# Patient Record
Sex: Male | Born: 1960 | Race: White | Hispanic: No | Smoking: Current every day smoker
Health system: Southern US, Community
[De-identification: ages and names within clinical notes are randomized; demographics above are authoritative.]

---

## 2011-10-02 ENCOUNTER — Ambulatory Visit: Payer: PRIVATE HEALTH INSURANCE

## 2011-10-02 ENCOUNTER — Encounter: Payer: Self-pay | Admitting: Family Medicine

## 2011-10-02 ENCOUNTER — Ambulatory Visit (INDEPENDENT_AMBULATORY_CARE_PROVIDER_SITE_OTHER): Payer: PRIVATE HEALTH INSURANCE | Admitting: Family Medicine

## 2011-10-02 VITALS — BP 134/100 | HR 98 | Temp 98.2°F | Resp 16 | Ht 71.0 in | Wt 179.0 lb

## 2011-10-02 DIAGNOSIS — M25519 Pain in unspecified shoulder: Secondary | ICD-10-CM

## 2011-10-02 DIAGNOSIS — R0781 Pleurodynia: Secondary | ICD-10-CM

## 2011-10-02 DIAGNOSIS — R079 Chest pain, unspecified: Secondary | ICD-10-CM

## 2011-10-02 MED ORDER — HYDROCODONE-ACETAMINOPHEN 5-500 MG PO TABS: 1.0000 | ORAL_TABLET | Freq: Three times a day (TID) | ORAL | Status: AC | PRN
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
## 2011-11-05 ENCOUNTER — Ambulatory Visit: Payer: PRIVATE HEALTH INSURANCE

## 2011-11-05 ENCOUNTER — Ambulatory Visit (INDEPENDENT_AMBULATORY_CARE_PROVIDER_SITE_OTHER): Payer: PRIVATE HEALTH INSURANCE | Admitting: Emergency Medicine

## 2011-11-05 ENCOUNTER — Telehealth: Payer: Self-pay | Admitting: Emergency Medicine

## 2011-11-05 VITALS — BP 125/81 | HR 81 | Temp 98.9°F | Resp 17 | Ht 71.0 in | Wt 175.0 lb

## 2011-11-05 DIAGNOSIS — R079 Chest pain, unspecified: Secondary | ICD-10-CM

## 2011-11-05 DIAGNOSIS — S2239XA Fracture of one rib, unspecified side, initial encounter for closed fracture: Secondary | ICD-10-CM

## 2011-11-05 NOTE — Telephone Encounter (Signed)
Patient notified

## 2011-11-05 NOTE — Progress Notes (Signed)
  Subjective:    Patient ID: Steve Wright, male    DOB: 07-06-1960, 51 y.o.   MRN: [id number]  HPI patient has 2 warts on his ankle he would like removed by freezing . He was also seen approximately one month ago with a left eighth rib fracture and continues to have pain in that area.    Review of Systems     Objective:   Physical Exam chest exam is clear to auscultation. There is tenderness over the left lateral ribs. Abdomen is soft. There 1 x 1 cm warts over the left Achilles    [facility] reading (PRIMARY) by  Dr. Cleta Alberts there is a nondisplaced left eighth rib fracture with no pneumothorax. There is also fracture of the ninth rib. I received a call from the radiologist and there  may be a new fracture lateral left seventh and eighth ribs there does appear to be a fracture at the base of the scapula. .       Assessment & Plan:  Patient to return to clinic once we have by liquid nitrogen and will take care of his warts. Patient will be informed of his x-ray report

## 2011-11-05 NOTE — Telephone Encounter (Signed)
Please call patient and tell him the fractures of the eighth and ninth ribs are visible as well as possible new fracture of the seventh rib. There also appears to be a crack of the scapula. This does not change any of his treatment

## 2011-11-12 ENCOUNTER — Ambulatory Visit (INDEPENDENT_AMBULATORY_CARE_PROVIDER_SITE_OTHER): Payer: PRIVATE HEALTH INSURANCE | Admitting: Emergency Medicine

## 2011-11-12 VITALS — BP 134/75 | HR 90 | Temp 98.2°F | Resp 16 | Ht 72.0 in | Wt 176.0 lb

## 2011-11-12 DIAGNOSIS — B079 Viral wart, unspecified: Secondary | ICD-10-CM

## 2011-11-12 NOTE — Progress Notes (Signed)
  Subjective:    Patient ID: Steve Wright, male    DOB: June 27, 1960, 51 y.o.   MRN: [id number]  HPI patient returns at my request for cryotherapy of 2 warts over the left Achilles tendon to    Review of Systems     Objective:   Physical Exam there are 2 warts present on the left Achilles. They measure about 6 mm. Each were frozen with liquid nitrogen for 8 seconds x2        Assessment & Plan:  Warts treated with cryotherapy x2

## 2013-06-13 IMAGING — CR DG SHOULDER 2+V*L*
3 series · 3 of 3 positions shown · non-contrast
Comparison: Chest x-ray and rib series today.

CLINICAL DATA: a fall, left shoulder pain.

LEFT SHOULDER - 2+ VIEW

[ap ext rot]
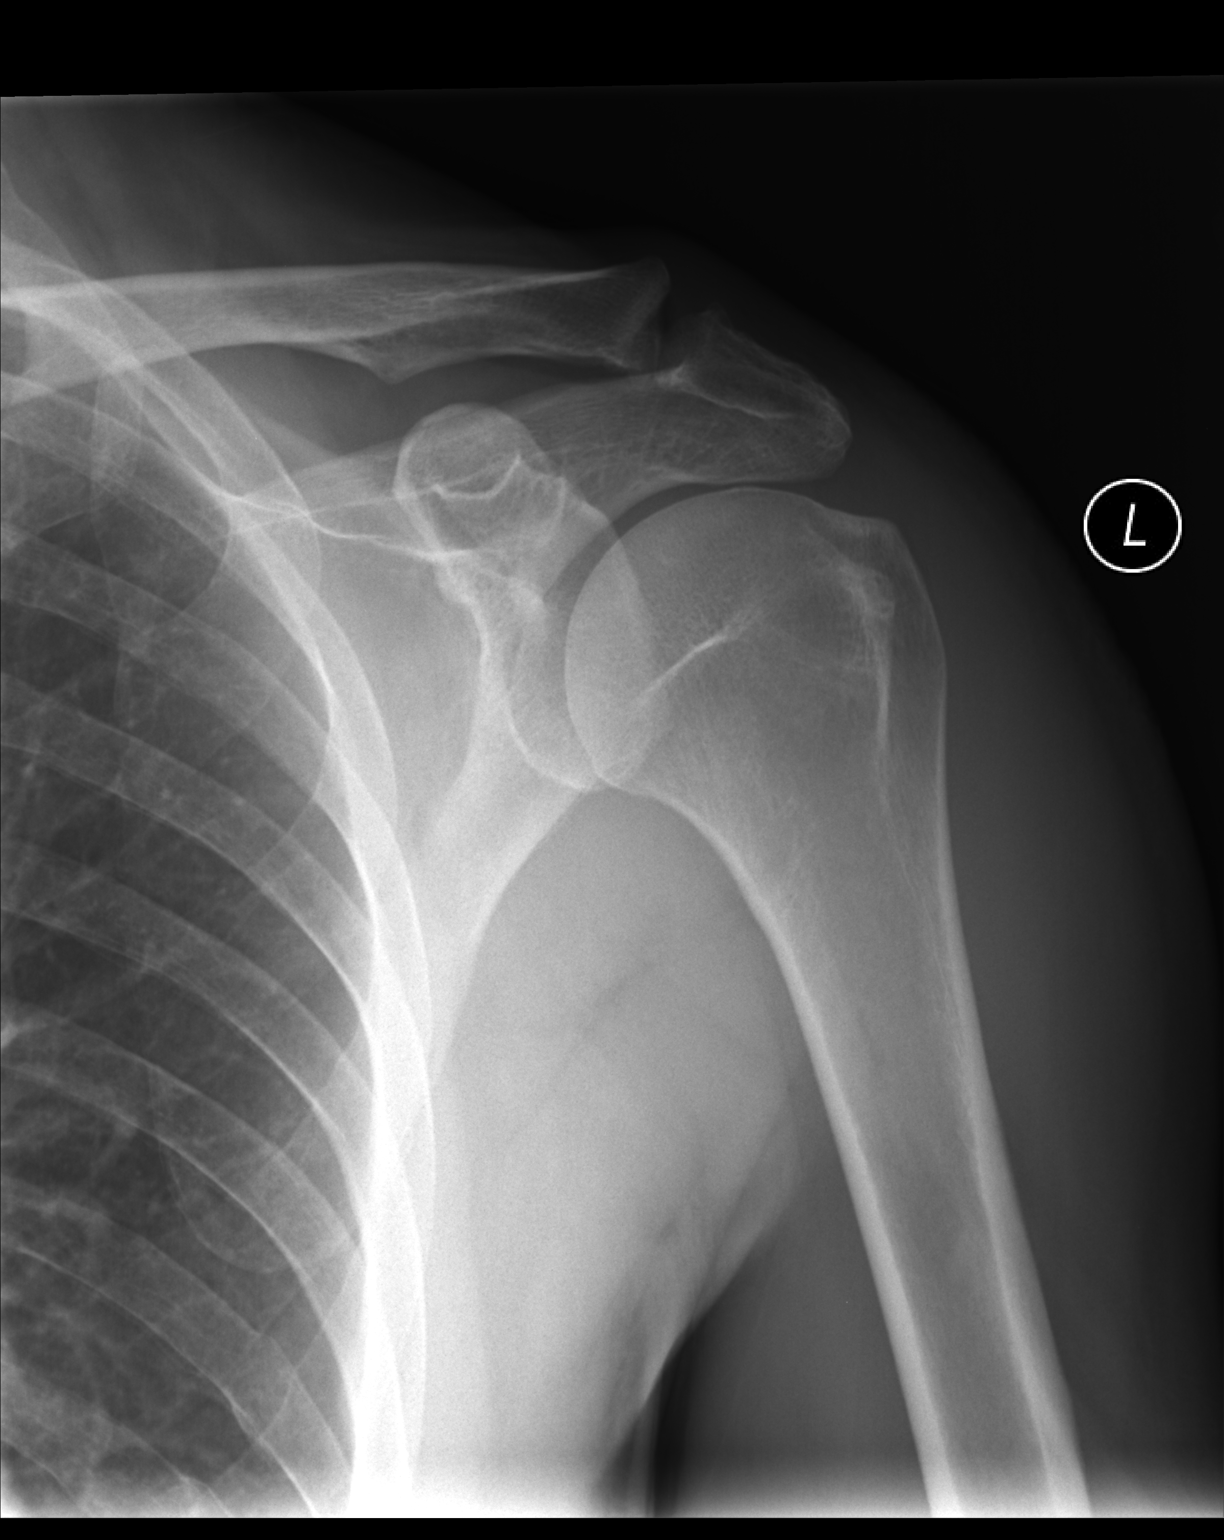

[ap int rot]
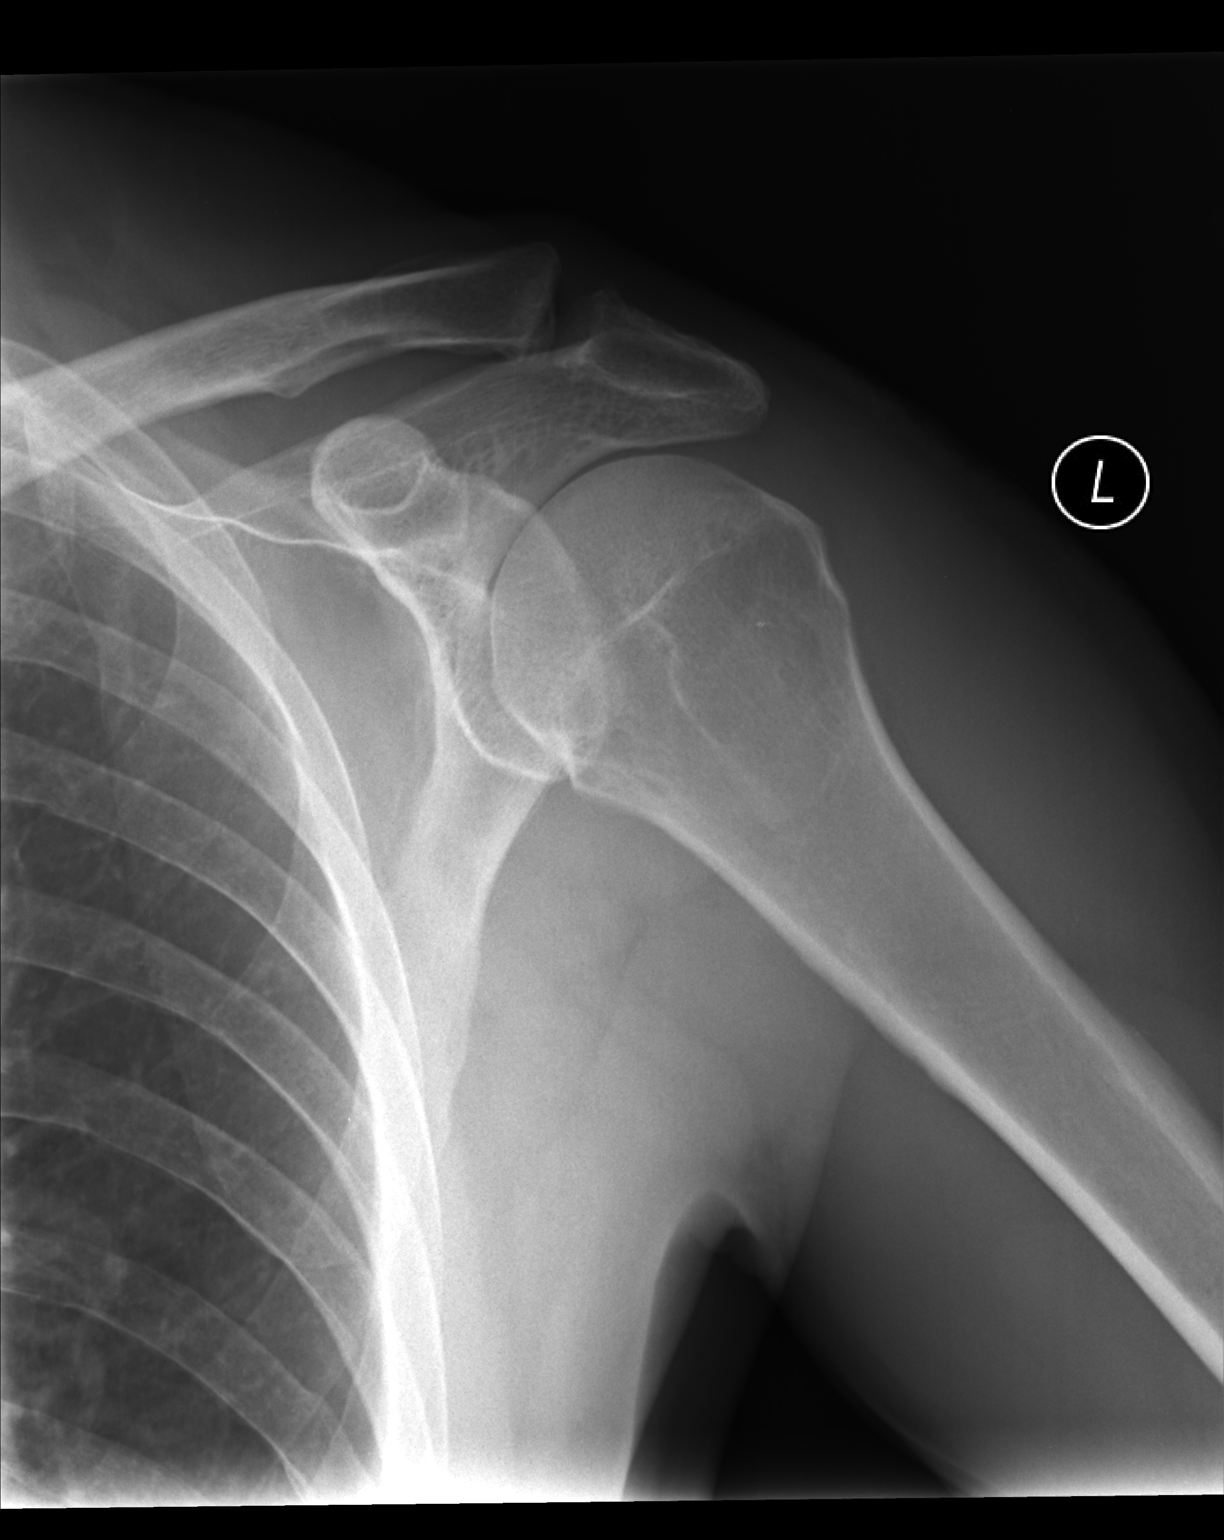

[pa y view]
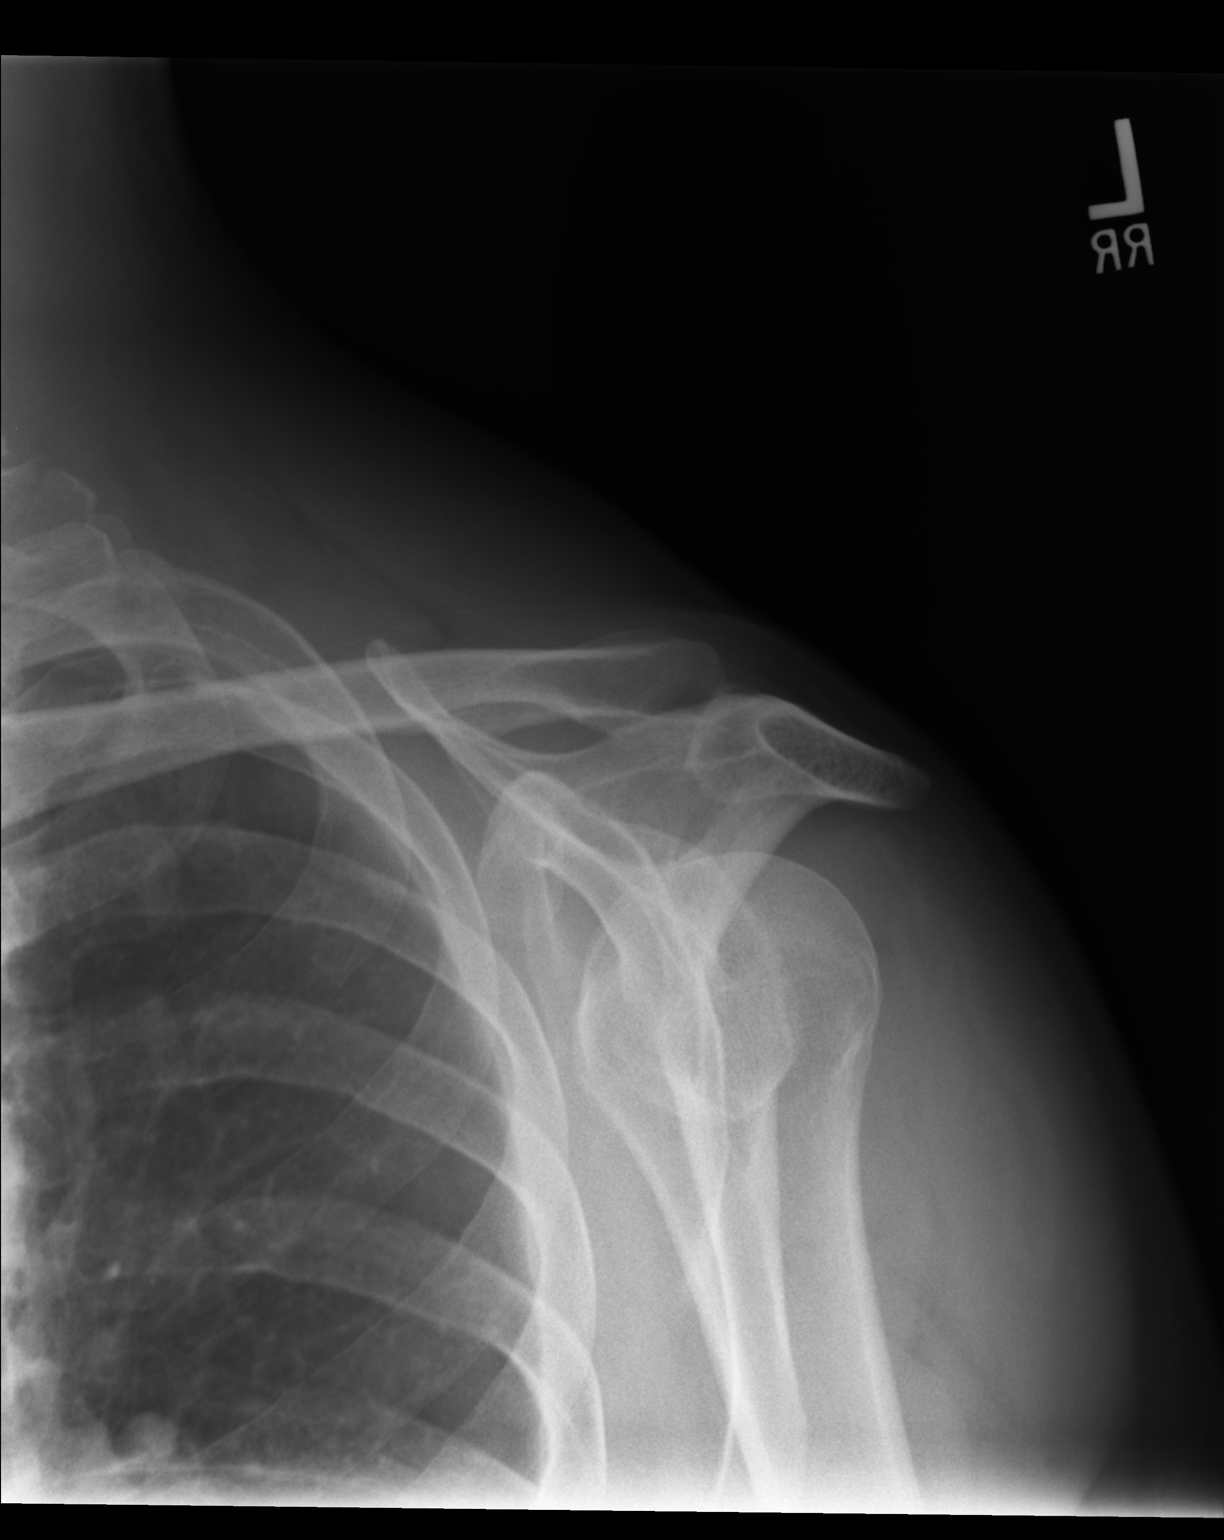

[3 of 3 positions shown; findings below may reference images not displayed]

FINDINGS: No acute bony abnormality within the left shoulder.  No
fracture, subluxation or dislocation.  Again noted is the buckle
fracture through the left posterior lateral eighth rib as seen on
rib series.
IMPRESSION: Nondisplaced left eighth rib fracture.

No acute bony abnormality within the left shoulder.

Clinically significant discrepancy from primary report, if
provided: None

## 2013-07-17 IMAGING — CR DG RIBS W/ CHEST 3+V*L*
4 series · 4 of 4 positions shown · non-contrast
Comparison: 10/02/2011

***ADDENDUM*** CREATED: 11/05/2011 [DATE]

There is irregularity along the inferior aspect of the left
scapula.  Findings are concerning for a fracture in this area.
***END ADDENDUM*** SIGNED BY: Jag Grau, M.D.
CLINICAL DATA: Evaluate for rib fracture.
LEFT RIBS AND CHEST - 3+ VIEW

[PA (1 of 2)]
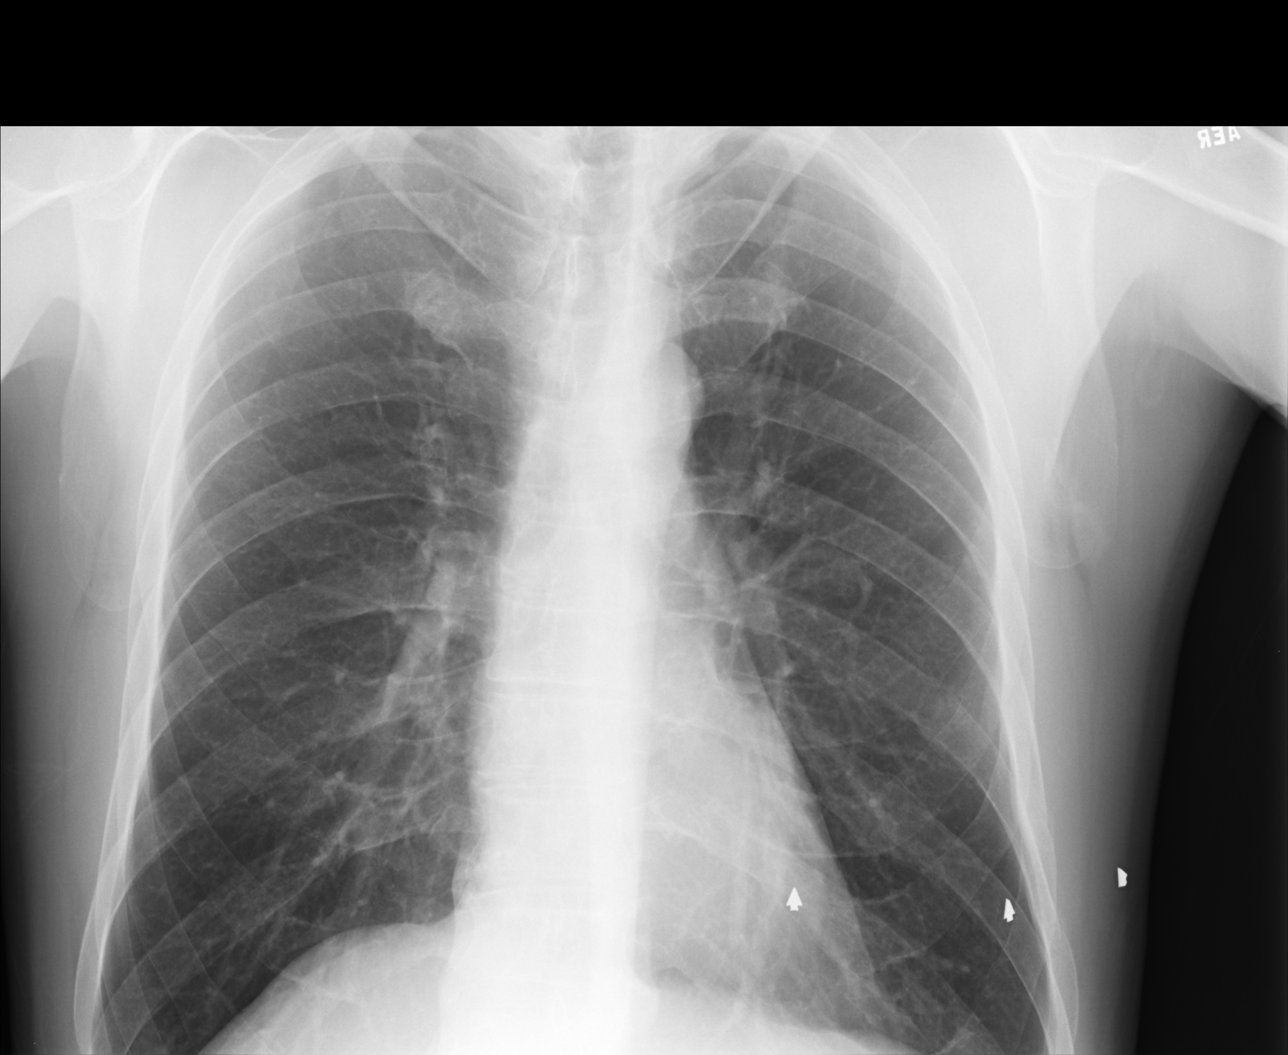

[rao]
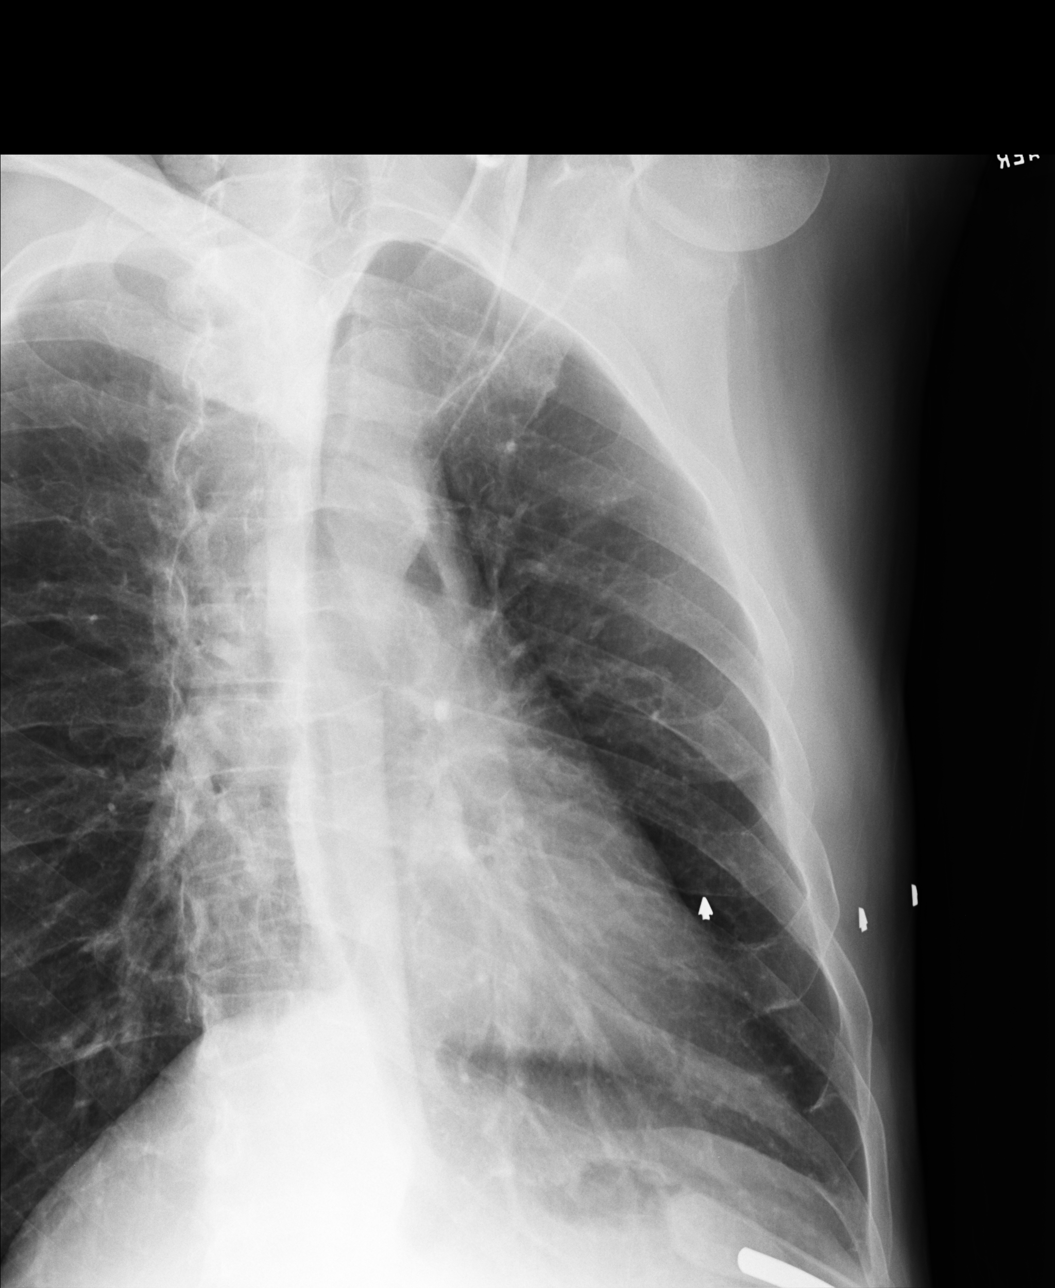

[lao]
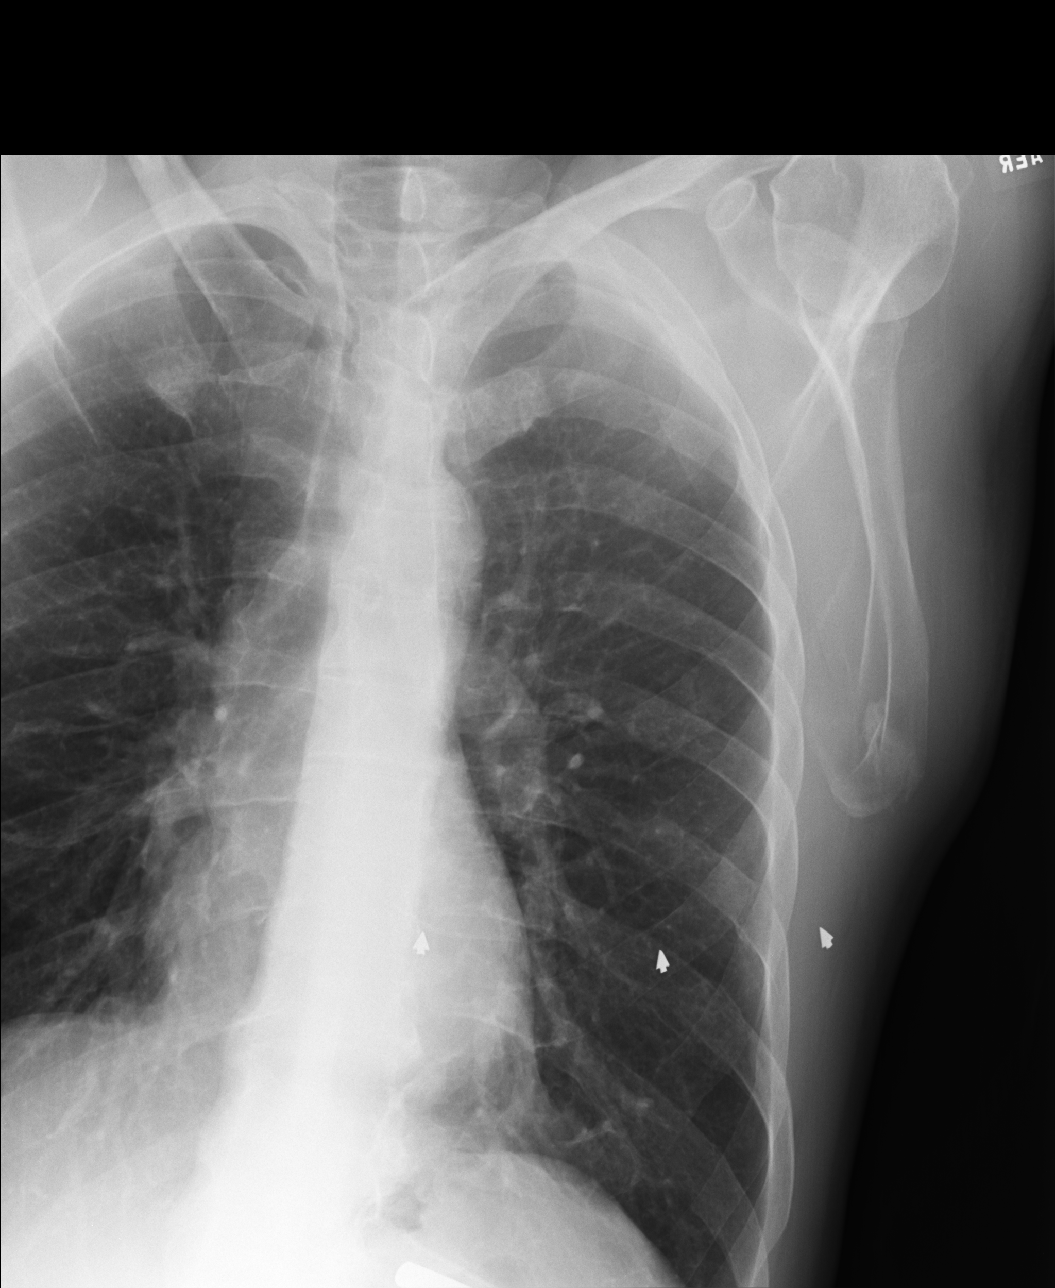

[PA (2 of 2)]
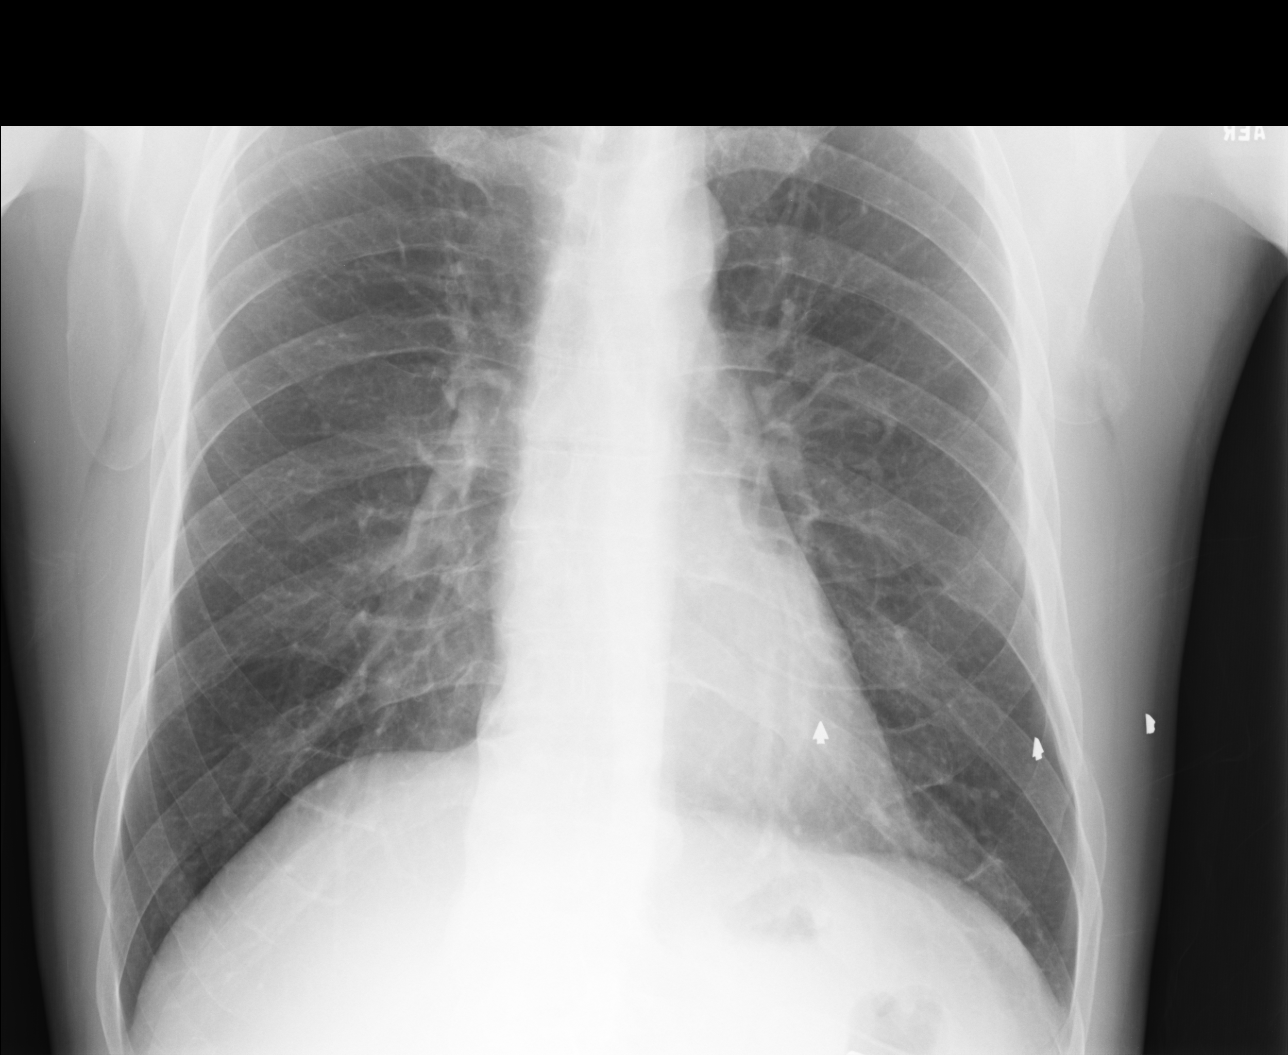

[4 of 4 positions shown; findings below may reference images not displayed]

FINDINGS: Again noted is a mildly displaced left eighth rib
fracture with some callus formation.  There is also mild displaced
left ninth rib fracture which was present on the prior examination.
There is concern for new mildly displaced fractures involving the
lateral left seventh and eighth ribs.  Lungs are clear without
airspace disease or pneumothorax. Heart and mediastinum are within
normal limits.  Trachea is midline.
IMPRESSION: Concern for new fractures involving the lateral left
seventh and eighth ribs.

Old fractures of the left eighth and ninth ribs.

These results will be called to the ordering clinician or
representative by the Radiologist Assistant, and communication
documented in the PACS Dashboard.

## 2014-11-02 ENCOUNTER — Ambulatory Visit (INDEPENDENT_AMBULATORY_CARE_PROVIDER_SITE_OTHER): Payer: BLUE CROSS/BLUE SHIELD | Admitting: Family Medicine

## 2014-11-02 VITALS — BP 104/64 | HR 88 | Temp 98.6°F | Resp 18 | Ht 72.0 in | Wt 173.0 lb

## 2014-11-02 DIAGNOSIS — Z23 Encounter for immunization: Secondary | ICD-10-CM

## 2014-11-02 DIAGNOSIS — S0101XA Laceration without foreign body of scalp, initial encounter: Secondary | ICD-10-CM

## 2014-11-02 DIAGNOSIS — L253 Unspecified contact dermatitis due to other chemical products: Secondary | ICD-10-CM

## 2014-11-02 DIAGNOSIS — B001 Herpesviral vesicular dermatitis: Secondary | ICD-10-CM

## 2014-11-02 MED ORDER — HYDROCORTISONE 2.5 % EX OINT: TOPICAL_OINTMENT | Freq: Two times a day (BID) | CUTANEOUS | Status: DC

## 2014-11-02 MED ORDER — VALACYCLOVIR HCL 1 G PO TABS: 2000.0000 mg | ORAL_TABLET | Freq: Two times a day (BID) | ORAL | Status: DC

## 2014-11-02 NOTE — Patient Instructions (Signed)

## 2014-11-02 NOTE — Progress Notes (Signed)
Subjective:   This chart was scribed for Nilda Simmer, MD by Jarvis Morgan, ED Scribe. This patient was seen in Room 1 and the patient's care was started at 5:27 PM.    Patient ID: Steve Wright, male    DOB: 09/19/1960, 54 y.o.   MRN: [id number]  11/02/2014  Rash and Other   HPI  HPI Comments: Steve Wright is a 54 y.o. male who presents to the [facility] complaining of an intermittent, dry, cracked, painful, red rash to his B thumbs and right index finger onset 1 year. 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 He denies any known allergies. He reports he has used antifungal cream sporadically for his rash with no relief. He notes he also hit an area to the right parietal side of his head while working outside yesterday and has a small laceration to the area; he reports he had significant bleeding to the area yesterday. There is no active bleeding at this time. He thinks he may have hit it on wood or a nail. He states his tetanus is not up to date. He does not typically wear gloves when working outside. Pt denies any surgical history. He denies any fever, chills, HA or other complaints.  Pt has a second complaint of recurrent blisters to his right upper and lower lip. He states he gets them in the same area on a regular basis. Pt notes they are exacerbated by sunlight and stress. Pt would like medication to treat the area. He currently uses Abreva.  PCP: Tally Due, MD   Review of Systems  Constitutional: Negative for fever and chills.  Skin: Positive for color change, rash and wound.  Neurological: Positive for numbness. Negative for dizziness and headaches.    History reviewed. No pertinent past medical history. History reviewed. No pertinent past surgical history. No Known Allergies Current Outpatient Prescriptions  Medication Sig Dispense  Refill  . hydrocortisone 2.5 % ointment Apply topically 2 (two) times daily. 30 g 3  . valACYclovir (VALTREX) 1000 MG tablet Take 2 tablets (2,000 mg total) by mouth 2 (two) times daily. For one day with each outbreak 30 tablet 0   No current facility-administered medications for this visit.   Social History   Social History  . Marital Status: Divorced    Spouse Name: N/A  . Number of Children: N/A  . Years of Education: N/A   Occupational History  . Not on file.   Social History Main Topics  . Smoking status: Current Every Day Smoker -- 1.00 packs/day for 34 years    Types: Cigarettes  . Smokeless tobacco: Not on file  . Alcohol Use: Not on file  . Drug Use: Not on file  . Sexual Activity: Not on file   Other Topics Concern  . Not on file   Social History Narrative       Objective:    Triage Vitals: BP 104/64 mmHg  Pulse 88  Temp(Src) 98.6 F (37 C)  Resp 18  Ht 6' (1.829 m)  Wt 173 lb (78.472 kg)  BMI 23.46 kg/m2  SpO2 98%  Physical Exam  Constitutional: He is oriented to person, place, and time. He appears well-developed and well-nourished. No distress.  HENT:  Head: Normocephalic.  Linear well approximated laceration about 2cm along parietal region  Eyes: Conjunctivae  and EOM are normal.  Neck: Neck supple. No tracheal deviation present.  Musculoskeletal: Normal range of motion.  Neurological: He is alert and oriented to person, place, and time.  Skin: Skin is warm and dry. Rash noted. He is not diaphoretic. No erythema.  Left thumb with thickening of skin with fissuring present.  Similar changes on right thumb proximally and along 2nd fight finger at DIP joint with fissuring.  R upper lip with small vesicular lesion.  Psychiatric: His behavior is normal.  Nursing note and vitals reviewed.  No results found for this or any previous visit.   TDAP ADMINISTERED.    Assessment & Plan:   1. Herpes labialis   2. Laceration of scalp, initial encounter     3. Contact dermatitis due to chemicals     1.  Herpes Labialis:  Recurrent; rx for Valtrex 1gram provided to use PRN. 2.  Laceration of scalp initial encounter:  New.  S/p TDAP 3.  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 Rx for hydrocortisone ointment 2.5% provided to use bid; recommend applying vaseline to hands qhs.  Recommend wearing gloves during work.   Meds ordered this encounter  Medications  . hydrocortisone 2.5 % ointment    Sig: Apply topically 2 (two) times daily.    Dispense:  30 g    Refill:  3  . valACYclovir (VALTREX) 1000 MG tablet    Sig: Take 2 tablets (2,000 mg total) by mouth 2 (two) times daily. For one day with each outbreak    Dispense:  30 tablet    Refill:  0    No Follow-up on file.  I personally performed the services described in this documentation, which was scribed in my presence. The recorded information has been reviewed and considered.  Nishawn Rotan Paulita Fujita, M.D. [facility] [address] [place], [place]  [address] [phone] phone [phone] fax

## 2016-08-27 ENCOUNTER — Encounter: Payer: Self-pay | Admitting: Family Medicine

## 2016-08-27 ENCOUNTER — Ambulatory Visit (INDEPENDENT_AMBULATORY_CARE_PROVIDER_SITE_OTHER): Payer: PRIVATE HEALTH INSURANCE | Admitting: Family Medicine

## 2016-08-27 VITALS — BP 120/73 | HR 84 | Temp 98.3°F | Resp 18 | Ht 71.22 in | Wt 167.8 lb

## 2016-08-27 DIAGNOSIS — B001 Herpesviral vesicular dermatitis: Secondary | ICD-10-CM

## 2016-08-27 DIAGNOSIS — Z1211 Encounter for screening for malignant neoplasm of colon: Secondary | ICD-10-CM | POA: Diagnosis not present

## 2016-08-27 DIAGNOSIS — S61411A Laceration without foreign body of right hand, initial encounter: Secondary | ICD-10-CM | POA: Diagnosis not present

## 2016-08-27 DIAGNOSIS — B078 Other viral warts: Secondary | ICD-10-CM

## 2016-08-27 DIAGNOSIS — R600 Localized edema: Secondary | ICD-10-CM | POA: Diagnosis not present

## 2016-08-27 MED ORDER — VALACYCLOVIR HCL 1 G PO TABS: 2000.0000 mg | ORAL_TABLET | Freq: Two times a day (BID) | ORAL | 0 refills | Status: DC
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
## 2016-08-27 NOTE — Patient Instructions (Addendum)
IF you received an x-ray today, you will receive an invoice from [facility]. Please contact [facility] at [phone] with questions or concerns regarding your invoice.   IF you received labwork today, you will receive an invoice from [place]. Please contact [facility] at [phone] with questions or concerns regarding your invoice.   Our billing staff will not be able to assist you with questions regarding bills from these companies.  You will be contacted with the lab results as soon as they are available. The fastest way to get your results is to activate your My Chart account. Instructions are located on the last page of this paperwork. If you have not heard from [place] regarding the results in 2 weeks, please contact this office.      Peripheral Edema Peripheral edema is swelling that is caused by a buildup of fluid. Peripheral edema most often affects the lower legs, ankles, and feet. It can also develop in the arms, hands, and face. The area of the body that has peripheral edema will look swollen. It may also feel heavy or warm. Your clothes may start to feel tight. Pressing on the area may make a temporary dent in your skin. You may not be able to move your arm or leg as much as usual. There are many causes of peripheral edema. It can be a complication of other diseases, such as congestive heart failure, kidney disease, or a problem with your blood circulation. It also can be a side effect of certain medicines. It often happens to women during pregnancy. Sometimes, the cause is not known. Treating the underlying condition is often the only treatment for peripheral edema. Follow these instructions at home: Pay attention to any changes in your symptoms. Take these actions to help with your discomfort:  Raise (elevate) your legs while you are sitting or lying down.  Move around often to prevent stiffness and to lessen swelling. Do not sit or stand for long periods of  time.  Wear support stockings as told by your health care provider.  Follow instructions from your health care provider about limiting salt (sodium) in your diet. Sometimes eating less salt can reduce swelling.  Take over-the-counter and prescription medicines only as told by your health care provider. Your health care provider may prescribe medicine to help your body get rid of excess water (diuretic).  Keep all follow-up visits as told by your health care provider. This is important.  Contact a health care provider if:  You have a fever.  Your edema starts suddenly or is getting worse, especially if you are pregnant or have a medical condition.  You have swelling in only one leg.  You have increased swelling and pain in your legs. Get help right away if:  You develop shortness of breath, especially when you are lying down.  You have pain in your chest or abdomen.  You feel weak.  You faint. This information is not intended to replace advice given to you by your health care provider. Make sure you discuss any questions you have with your health care provider. Document Released: 04/10/2004 Document Revised: 08/06/2015 Document Reviewed: 09/13/2014 Elsevier Interactive Patient Education  2018 [employer] Inc[employer] Pneumococcal Polysaccharide Vaccine: What You Need to Know 1. Why get vaccinated? Vaccination can protect older adults (and some children and younger adults) from pneumococcal disease. Pneumococcal disease is caused by bacteria that can spread from person to person through close contact. It can cause ear infections, and it can  also lead to more serious infections of the:  Lungs (pneumonia),  Blood (bacteremia), and  Covering of the brain and spinal cord (meningitis). Meningitis can cause deafness and brain damage, and it can be fatal.  Anyone can get pneumococcal disease, but children under 442 years of age, people with certain medical conditions, adults over 56 years of  age, and cigarette smokers are at the highest risk. About 18,000 older adults die each year from pneumococcal disease in the [place]. Treatment of pneumococcal infections with penicillin and other drugs used to be more effective. But some strains of the disease have become resistant to these drugs. This makes prevention of the disease, through vaccination, even more important. 2. Pneumococcal polysaccharide vaccine (PPSV23) Pneumococcal polysaccharide vaccine (PPSV23) protects against 23 types of pneumococcal bacteria. It will not prevent all pneumococcal disease. PPSV23 is recommended for:  All adults 56 years of age and older,  Anyone 2 through 56 years of age with certain long-term health problems,  Anyone 2 through 56 years of age with a weakened immune system,  Adults 6519 through 56 years of age who smoke cigarettes or have asthma.  Most people need only one dose of PPSV. A second dose is recommended for certain high-risk groups. People 1165 and older should get a dose even if they have gotten one or more doses of the vaccine before they turned 65. Your healthcare provider can give you more information about these recommendations. Most healthy adults develop protection within 2 to 3 weeks of getting the shot. 3. Some people should not get this vaccine  Anyone who has had a life-threatening allergic reaction to PPSV should not get another dose.  Anyone who has a severe allergy to any component of PPSV should not receive it. Tell your provider if you have any severe allergies.  Anyone who is moderately or severely ill when the shot is scheduled may be asked to wait until they recover before getting the vaccine. Someone with a mild illness can usually be vaccinated.  Children less than 512 years of age should not receive this vaccine.  There is no evidence that PPSV is harmful to either a pregnant woman or to her fetus. However, as a precaution, women who need the vaccine should be  vaccinated before becoming pregnant, if possible. 4. Risks of a vaccine reaction With any medicine, including vaccines, there is a chance of side effects. These are usually mild and go away on their own, but serious reactions are also possible. About half of people who get PPSV have mild side effects, such as redness or pain where the shot is given, which go away within about two days. Less than 1 out of 100 people develop a fever, muscle aches, or more severe local reactions. Problems that could happen after any vaccine:  People sometimes faint after a medical procedure, including vaccination. Sitting or lying down for about 15 minutes can help prevent fainting, and injuries caused by a fall. Tell your doctor if you feel dizzy, or have vision changes or ringing in the ears.  Some people get severe pain in the shoulder and have difficulty moving the arm where a shot was given. This happens very rarely.  Any medication can cause a severe allergic reaction. Such reactions from a vaccine are very rare, estimated at about 1 in a million doses, and would happen within a few minutes to a few hours after the vaccination. As with any medicine, there is a very remote chance of a  vaccine causing a serious injury or death. The safety of vaccines is always being monitored. For more information, visit: http://floyd.org/ 5. What if there is a serious reaction? What should I look for? Look for anything that concerns you, such as signs of a severe allergic reaction, very high fever, or unusual behavior. Signs of a severe allergic reaction can include hives, swelling of the face and throat, difficulty breathing, a fast heartbeat, dizziness, and weakness. These would usually start a few minutes to a few hours after the vaccination. What should I do? If you think it is a severe allergic reaction or other emergency that can't wait, call 9-1-1 or get to the nearest hospital. Otherwise, call your  doctor. Afterward, the reaction should be reported to the Vaccine Adverse Event Reporting System (VAERS). Your doctor might file this report, or you can do it yourself through the VAERS web site at www.vaers.LAgents.no, or by calling 1-9401007951. VAERS does not give medical advice. 6. How can I learn more?  Ask your doctor. He or she can give you the vaccine package insert or suggest other sources of information.  Call your local or state health department.  Contact the Centers for Disease Control and Prevention (CDC): ? Call 510-353-7805 (1-800-CDC-INFO) or ? Visit CDC's website at PicCapture.uy CDC Pneumococcal Polysaccharide Vaccine VIS (07/08/13) This information is not intended to replace advice given to you by your health care provider. Make sure you discuss any questions you have with your health care provider. Document Released: 12/29/2005 Document Revised: 11/22/2015 Document Reviewed: 11/22/2015 Elsevier Interactive Patient Education  2017 Elsevier Inc.  Cryosurgery for Skin Conditions, Care After These instructions give you information on caring for yourself after your procedure. Your doctor may also give you more specific instructions. Call your doctor if you have any problems or questions after your procedure. Follow these instructions at home: Caring for the treated area  Follow instructions from your doctor about how to take care of the treated area. Make sure you: ? Keep the area covered with a bandage (dressing) until it heals, or for as long as told by your doctor. ? Wash your hands with soap and water before you change your bandage. If you do not have soap and water, use hand sanitizer. ? Change your bandage as told by your doctor. ? Keep the bandage and the treated area clean and dry. If the bandage gets wet, change it right away. ? Clean the treated area with soap and water.  Check the treated area every day for signs of infection. Check for: ? More  redness, swelling, or pain. ? More fluid or blood. ? Warmth. ? Pus or a bad smell. General instructions  Do not pick at your blister. Do not try to break it open. This can cause infection and scarring.  Do not put any medicine, cream, or lotion on the treated area unless told by your doctor.  Take over-the-counter and prescription medicines only as told by your doctor.  Keep all follow-up visits as told by your doctor. This is important. Contact a doctor if:  You have more redness, swelling, or pain around the treated area.  You have more fluid or blood coming from the treated area.  The treated area feels warm to the touch.  You have pus or a bad smell coming from the treated area.  Your blister gets large and painful. Get help right away if:  You have a fever and have redness spreading from the treated area. Summary  You  should keep the treated area and your bandage clean and dry.  Check the treated area every day for signs of infection. Signs include fluid, pus, warmth, or having more redness, swelling, or pain.  Do not pick at your blister. Do not try to break it open. This information is not intended to replace advice given to you by your health care provider. Make sure you discuss any questions you have with your health care provider. Document Released: 05/26/2011 Document Revised: 01/21/2016 Document Reviewed: 01/21/2016 Elsevier Interactive Patient Education  2017 [employer].

## 2016-08-28 LAB — CBC
HEMATOCRIT: 44.4 % (ref 37.5–51.0)
Hemoglobin: 14.7 g/dL (ref 13.0–17.7)
MCH: 29.9 pg (ref 26.6–33.0)
MCHC: 33.1 g/dL (ref 31.5–35.7)
MCV: 90 fL (ref 79–97)
PLATELETS: 298 10*3/uL (ref 150–379)
RBC: 4.91 x10E6/uL (ref 4.14–5.80)
RDW: 14.2 % (ref 12.3–15.4)
WBC: 7.1 10*3/uL (ref 3.4–10.8)

## 2016-08-28 LAB — COMPREHENSIVE METABOLIC PANEL
ALK PHOS: 91 IU/L (ref 39–117)
ALT: 16 IU/L (ref 0–44)
AST: 23 IU/L (ref 0–40)
Albumin/Globulin Ratio: 1.9 (ref 1.2–2.2)
Albumin: 4.6 g/dL (ref 3.5–5.5)
BUN/Creatinine Ratio: 8 — ABNORMAL LOW (ref 9–20)
BUN: 8 mg/dL (ref 6–24)
Bilirubin Total: 0.6 mg/dL (ref 0.0–1.2)
CALCIUM: 10 mg/dL (ref 8.7–10.2)
CO2: 28 mmol/L (ref 20–29)
CREATININE: 0.97 mg/dL (ref 0.76–1.27)
Chloride: 99 mmol/L (ref 96–106)
GFR calc Af Amer: 101 mL/min/{1.73_m2} (ref >59)
GFR, EST NON AFRICAN AMERICAN: 88 mL/min/{1.73_m2} (ref >59)
GLUCOSE: 128 mg/dL — AB (ref 65–99)
Globulin, Total: 2.4 g/dL (ref 1.5–4.5)
Potassium: 4.2 mmol/L (ref 3.5–5.2)
SODIUM: 141 mmol/L (ref 134–144)
Total Protein: 7 g/dL (ref 6.0–8.5)

## 2016-08-28 LAB — TSH: TSH: 2.08 u[IU]/mL (ref 0.450–4.500)

## 2016-08-28 LAB — URIC ACID: Uric Acid: 5.9 mg/dL (ref 3.7–8.6)

## 2016-08-29 ENCOUNTER — Encounter: Payer: Self-pay | Admitting: Family Medicine

## 2017-12-31 ENCOUNTER — Ambulatory Visit (INDEPENDENT_AMBULATORY_CARE_PROVIDER_SITE_OTHER): Payer: PRIVATE HEALTH INSURANCE | Admitting: Physician Assistant

## 2017-12-31 ENCOUNTER — Ambulatory Visit (INDEPENDENT_AMBULATORY_CARE_PROVIDER_SITE_OTHER): Payer: PRIVATE HEALTH INSURANCE

## 2017-12-31 ENCOUNTER — Other Ambulatory Visit: Payer: Self-pay

## 2017-12-31 ENCOUNTER — Encounter: Payer: Self-pay | Admitting: Physician Assistant

## 2017-12-31 VITALS — BP 107/75 | HR 89 | Temp 97.9°F | Resp 20 | Ht 71.73 in | Wt 165.4 lb

## 2017-12-31 DIAGNOSIS — M5441 Lumbago with sciatica, right side: Secondary | ICD-10-CM

## 2017-12-31 DIAGNOSIS — G8929 Other chronic pain: Secondary | ICD-10-CM

## 2017-12-31 LAB — GLUCOSE, POCT (MANUAL RESULT ENTRY): POC Glucose: 94 mg/dl (ref 70–99)

## 2017-12-31 MED ORDER — METHYLPREDNISOLONE ACETATE 80 MG/ML IJ SUSP
80.0000 mg | Freq: Once | INTRAMUSCULAR | Status: AC
  Administered 2017-12-31: 80 mg via INTRAMUSCULAR

## 2017-12-31 MED ORDER — PREDNISONE 20 MG PO TABS: ORAL_TABLET | ORAL | 0 refills | Status: DC

## 2017-12-31 NOTE — Patient Instructions (Addendum)
Start oral pred tomororw. We are going to treat your underlying inflammation with oral prednisone. Prednisone is a steroid and can cause side effects such as headache, irritability, nausea, vomiting, increased heart rate, increased blood pressure, increased blood sugar, appetite changes, and insomnia. Please take tablets in the morning with a full meal to help decrease the chances of these side effects.   Return to clinic if symptoms worsen, do not improve, or as needed  Sciatica Rehab Ask your health care provider which exercises are safe for you. Do exercises exactly as told by your health care provider and adjust them as directed. It is normal to feel mild stretching, pulling, tightness, or discomfort as you do these exercises, but you should stop right away if you feel sudden pain or your pain gets worse.Do not begin these exercises until told by your health care provider. Stretching and range of motion exercises These exercises warm up your muscles and joints and improve the movement and flexibility of your hips and your back. These exercises also help to relieve pain, numbness, and tingling. Exercise A: Sciatic nerve glide 1. Sit in a chair with your head facing down toward your chest. Place your hands behind your back. Let your shoulders slump forward. 2. Slowly straighten one of your knees while you tilt your head back as if you are looking toward the ceiling. Only straighten your leg as far as you can without making your symptoms worse. 3. Hold for __________ seconds. 4. Slowly return to the starting position. 5. Repeat with your other leg. Repeat __________ times. Complete this exercise __________ times a day. Exercise B: Knee to chest with hip adduction and internal rotation  1. Lie on your back on a firm surface with both legs straight. 2. Bend one of your knees and move it up toward your chest until you feel a gentle stretch in your lower back and buttock. Then, move your knee toward  the shoulder that is on the opposite side from your leg. ? Hold your leg in this position by holding onto the front of your knee. 3. Hold for __________ seconds. 4. Slowly return to the starting position. 5. Repeat with your other leg. Repeat __________ times. Complete this exercise __________ times a day. Exercise C: Prone extension on elbows  1. Lie on your abdomen on a firm surface. A bed may be too soft for this exercise. 2. Prop yourself up on your elbows. 3. Use your arms to help lift your chest up until you feel a gentle stretch in your abdomen and your lower back. ? This will place some of your body weight on your elbows. If this is uncomfortable, try stacking pillows under your chest. ? Your hips should stay down, against the surface that you are lying on. Keep your hip and back muscles relaxed. 4. Hold for __________ seconds. 5. Slowly relax your upper body and return to the starting position. Repeat __________ times. Complete this exercise __________ times a day. Strengthening exercises These exercises build strength and endurance in your back. Endurance is the ability to use your muscles for a long time, even after they get tired. Exercise D: Pelvic tilt 1. Lie on your back on a firm surface. Bend your knees and keep your feet flat. 2. Tense your abdominal muscles. Tip your pelvis up toward the ceiling and flatten your lower back into the floor. ? To help with this exercise, you may place a small towel under your lower back and try to push  your back into the towel. 3. Hold for __________ seconds. 4. Let your muscles relax completely before you repeat this exercise. Repeat __________ times. Complete this exercise __________ times a day. Exercise E: Alternating arm and leg raises  1. Get on your hands and knees on a firm surface. If you are on a hard floor, you may want to use padding to cushion your knees, such as an exercise mat. 2. Line up your arms and legs. Your hands  should be below your shoulders, and your knees should be below your hips. 3. Lift your left leg behind you. At the same time, raise your right arm and straighten it in front of you. ? Do not lift your leg higher than your hip. ? Do not lift your arm higher than your shoulder. ? Keep your abdominal and back muscles tight. ? Keep your hips facing the ground. ? Do not arch your back. ? Keep your balance carefully, and do not hold your breath. 4. Hold for __________ seconds. 5. Slowly return to the starting position and repeat with your right leg and your left arm. Repeat __________ times. Complete this exercise __________ times a day. Posture and body mechanics  Body mechanics refers to the movements and positions of your body while you do your daily activities. Posture is part of body mechanics. Good posture and healthy body mechanics can help to relieve stress in your body's tissues and joints. Good posture means that your spine is in its natural S-curve position (your spine is neutral), your shoulders are pulled back slightly, and your head is not tipped forward. The following are general guidelines for applying improved posture and body mechanics to your everyday activities. Standing   When standing, keep your spine neutral and your feet about hip-width apart. Keep a slight bend in your knees. Your ears, shoulders, and hips should line up.  When you do a task in which you stand in one place for a long time, place one foot up on a stable object that is 2-4 inches (5-10 cm) high, such as a footstool. This helps keep your spine neutral. Sitting   When sitting, keep your spine neutral and keep your feet flat on the floor. Use a footrest, if necessary, and keep your thighs parallel to the floor. Avoid rounding your shoulders, and avoid tilting your head forward.  When working at a desk or a computer, keep your desk at a height where your hands are slightly lower than your elbows. Slide your  chair under your desk so you are close enough to maintain good posture.  When working at a computer, place your monitor at a height where you are looking straight ahead and you do not have to tilt your head forward or downward to look at the screen. Resting   When lying down and resting, avoid positions that are most painful for you.  If you have pain with activities such as sitting, bending, stooping, or squatting (flexion-based activities), lie in a position in which your body does not bend very much. For example, avoid curling up on your side with your arms and knees near your chest (fetal position).  If you have pain with activities such as standing for a long time or reaching with your arms (extension-based activities), lie with your spine in a neutral position and bend your knees slightly. Try the following positions: ? Lying on your side with a pillow between your knees. ? Lying on your back with a pillow under your  knees. Lifting   When lifting objects, keep your feet at least shoulder-width apart and tighten your abdominal muscles.  Bend your knees and hips and keep your spine neutral. It is important to lift using the strength of your legs, not your back. Do not lock your knees straight out.  Always ask for help to lift heavy or awkward objects. This information is not intended to replace advice given to you by your health care provider. Make sure you discuss any questions you have with your health care provider. Document Released: 03/03/2005 Document Revised: 11/08/2015 Document Reviewed: 11/17/2014 Elsevier Interactive Patient Education  2018 [employer].   IF you received an x-ray today, you will receive an invoice from [facility]. Please contact [facility] at [phone] with questions or concerns regarding your invoice.   IF you received labwork today, you will receive an invoice from [place]. Please contact [facility] at [phone] with questions or  concerns regarding your invoice.   Our billing staff will not be able to assist you with questions regarding bills from these companies.  You will be contacted with the lab results as soon as they are available. The fastest way to get your results is to activate your My Chart account. Instructions are located on the last page of this paperwork. If you have not heard from [place] regarding the results in 2 weeks, please contact this office.

## 2017-12-31 NOTE — Progress Notes (Signed)
Steve Wright  MRN: [id number] DOB: 19-Aug-1960  Subjective:  Steve Wright is a 57 y.o. male seen in office today for a chief complaint of low back pain. The patient has had recurrent self limited episodes of low back pain in the past 9 years. Current symptoms have been present for 3 weeks and are gradually worsening, had the most posterior thigh burning yesterday.  Onset was related to / precipitated by no known injury but did fall and hit his back 3 months ago (did not have pain then). The pain is located in the right lumbar area or radiating to right leg(s). The pain is described as aching and stabbing and occurs intermittently.  Symptoms are exacerbated by certain movements and standing. Symptoms are improved by NSAIDs and stretching. He has also tried massage therapy and heating pad which provided no symptom relief. He denies  weakness in the right leg, weakness in the left leg, tingling in the right leg, tingling in the left leg, burning pain in the left leg, urinary hesitancy, urinary incontinence, urinary retention, bowel incontinence, constipation, impotence and groin/perineal numbness associated with the back pain. Has PMH of sciatica (last flare years ago), herniated disc (9 years ago). No PMH of diabetes.   Review of Systems  Constitutional: Negative for chills, diaphoresis, fatigue, fever and unexpected weight change.  Genitourinary: Negative for dysuria, frequency, hematuria and urgency.    There are no active problems to display for this patient.   Current Outpatient Medications on File Prior to Visit  Medication Sig Dispense Refill  . valACYclovir (VALTREX) 1000 MG tablet Take 2 tablets (2,000 mg total) by mouth 2 (two) times daily. For one day with each outbreak 30 tablet 0   No current facility-administered medications on file prior to visit.     No Known Allergies    Social History   Socioeconomic History  . Marital status: Divorced    Spouse name: Not on file  .  Number of children: 3  . Years of education: Not on file  . Highest education level: Not on file  Occupational History  . Not on file  Social Needs  . Financial resource strain: Not on file  . Food insecurity:    Worry: Not on file    Inability: Not on file  . Transportation needs:    Medical: Not on file    Non-medical: Not on file  Tobacco Use  . Smoking status: Current Every Day Smoker    Packs/day: 1.00    Years: 34.00    Pack years: 34.00    Types: Cigarettes  . Smokeless tobacco: Never Used  Substance and Sexual Activity  . Alcohol use: Yes    Alcohol/week: 60.0 standard drinks    Types: 30 Cans of beer, 30 Shots of liquor per week  . Drug use: Yes    Types: "Crack" cocaine, Marijuana, Cocaine  . Sexual activity: Not Currently  Lifestyle  . Physical activity:    Days per week: Not on file    Minutes per session: Not on file  . Stress: Not on file  Relationships  . Social connections:    Talks on phone: Not on file    Gets together: Not on file    Attends religious service: Not on file    Active member of club or organization: Not on file    Attends meetings of clubs or organizations: Not on file    Relationship status: Not on file  . Intimate partner violence:  Fear of current or ex partner: Not on file    Emotionally abused: Not on file    Physically abused: Not on file    Forced sexual activity: Not on file  Other Topics Concern  . Not on file  Social History Narrative  . Not on file    Objective:  BP 107/75   Pulse 89   Temp 97.9 F (36.6 C) (Oral)   Resp 20   Ht 5' 11.73" (1.822 m)   Wt 165 lb 6.4 oz (75 kg)   SpO2 98%   BMI 22.60 kg/m   Physical Exam  Constitutional: He is oriented to person, place, and time. He appears well-developed and well-nourished.  Appears uncomfortable, does not want to sit, continuously stretching out right leg during exam.   HENT:  Head: Normocephalic and atraumatic.  Eyes: Conjunctivae are normal.  Neck:  Normal range of motion.  Pulmonary/Chest: Effort normal.  Musculoskeletal:       Right hip: He exhibits tenderness (mild TTP with palpation of right sided gluteal musculature).       Thoracic back: Normal.       Lumbar back: He exhibits tenderness (+TTP with palpation of right side lumbar musculature). He exhibits normal range of motion, no bony tenderness, no swelling and no spasm.  Neurological: He is alert and oriented to person, place, and time.  Reflex Scores:      Patellar reflexes are 2+ on the right side and 2+ on the left side.      Achilles reflexes are 2+ on the right side and 2+ on the left side. Strength 5/5 of BLE Sensation of BLE intact Negative SLR bilaterally   Skin: Skin is warm and dry.  Psychiatric: He has a normal mood and affect.  Vitals reviewed.  Dg Lumbar Spine Complete  Result Date: 12/31/2017 CLINICAL DATA:  Back pain.  Fall. EXAM: LUMBAR SPINE - COMPLETE 4+ VIEW COMPARISON:  Chest x-ray 11/05/2011. FINDINGS: Lumbar spine scoliosis concave left. Diffuse multilevel degenerative change. No acute bony abnormality identified. No evidence of fracture. Aortic atherosclerotic vascular calcification. IMPRESSION: Lumbar spine scoliosis concave left. Diffuse multilevel degenerative change. No acute bony abnormality. Electronically Signed   By: Maisie Fus  Register   On: 12/31/2017 11:46   Results for orders placed or performed in visit on 12/31/17 (from the past 24 hour(s))  POCT glucose (manual entry)     Status: Normal   Collection Time: 12/31/17 12:15 PM  Result Value Ref Range   POC Glucose 94 70 - 99 mg/dl   Assessment and Plan :  1. Chronic right-sided low back pain with right-sided sciatica Hx and PE findings consistent with sciatica. Neg SLR today.  Plain film with no acute abnormality. No acute findings on neuro exam. No midline tenderness.  Recommend steroid shot and prednisone taper.  Continue stretching as tolerated. Given education material for low back  stretches. Avoid heavy lifting until pain improves. Advised to return to clinic if symptoms worsen, do not improve, or as needed.  - DG Lumbar Spine Complete; Future - POCT glucose (manual entry)  Meds ordered this encounter  Medications  . methylPREDNISolone acetate (DEPO-MEDROL) injection 80 mg  . predniSONE (DELTASONE) 20 MG tablet    Sig: Take 3 PO QAM x3days, 2 PO QAM x3days, 1 PO QAM x3days    Dispense:  18 tablet    Refill:  0    Order Specific Question:   Supervising Provider    Answer:   Georgina Quint [phone]  Benjiman Core PA-C  [facility] 12/31/2017 12:17 PM

## 2018-01-01 ENCOUNTER — Encounter: Payer: Self-pay | Admitting: Physician Assistant

## 2019-01-03 ENCOUNTER — Other Ambulatory Visit: Payer: Self-pay

## 2019-01-03 DIAGNOSIS — Z20822 Contact with and (suspected) exposure to covid-19: Secondary | ICD-10-CM

## 2019-01-05 ENCOUNTER — Telehealth: Payer: Self-pay | Admitting: Physician Assistant

## 2019-01-05 LAB — NOVEL CORONAVIRUS, NAA: SARS-CoV-2, NAA: NOT DETECTED

## 2019-01-05 NOTE — Telephone Encounter (Signed)
Negative COVID results given. Patient results "NOT Detected." Caller expressed understanding. ° °

## 2019-02-04 ENCOUNTER — Other Ambulatory Visit: Payer: Self-pay

## 2019-02-04 DIAGNOSIS — Z20822 Contact with and (suspected) exposure to covid-19: Secondary | ICD-10-CM

## 2019-02-06 ENCOUNTER — Telehealth: Payer: Self-pay

## 2019-02-06 LAB — NOVEL CORONAVIRUS, NAA: SARS-CoV-2, NAA: NOT DETECTED

## 2019-02-06 NOTE — Telephone Encounter (Signed)
Pt called for covid test results. Advised pt results are not back.

## 2019-02-08 ENCOUNTER — Telehealth: Payer: Self-pay | Admitting: Physician Assistant

## 2019-02-08 NOTE — Telephone Encounter (Signed)
Negative COVID results given. Patient results "NOT Detected." Caller expressed understanding. ° °

## 2020-01-12 ENCOUNTER — Emergency Department (HOSPITAL_COMMUNITY)
Admission: EM | Admit: 2020-01-12 | Discharge: 2020-01-12 | Disposition: A | Discharge status: Home / Self Care | Payer: 59 | Attending: Emergency Medicine | Admitting: Emergency Medicine

## 2020-01-12 ENCOUNTER — Other Ambulatory Visit: Payer: Self-pay

## 2020-01-12 DIAGNOSIS — R220 Localized swelling, mass and lump, head: Secondary | ICD-10-CM | POA: Insufficient documentation

## 2020-01-12 DIAGNOSIS — T7840XA Allergy, unspecified, initial encounter: Secondary | ICD-10-CM | POA: Insufficient documentation

## 2020-01-12 DIAGNOSIS — R21 Rash and other nonspecific skin eruption: Secondary | ICD-10-CM | POA: Diagnosis present

## 2020-01-12 LAB — CBC WITH DIFFERENTIAL/PLATELET
Abs Immature Granulocytes: 0.05 10*3/uL (ref 0.00–0.07)
Basophils Absolute: 0 10*3/uL (ref 0.0–0.1)
Basophils Relative: 0 %
Eosinophils Absolute: 0.1 10*3/uL (ref 0.0–0.5)
Eosinophils Relative: 1 %
HCT: 45.3 % (ref 39.0–52.0)
Hemoglobin: 15 g/dL (ref 13.0–17.0)
Immature Granulocytes: 0 %
Lymphocytes Relative: 12 %
Lymphs Abs: 1.6 10*3/uL (ref 0.7–4.0)
MCH: 30.4 pg (ref 26.0–34.0)
MCHC: 33.1 g/dL (ref 30.0–36.0)
MCV: 91.9 fL (ref 80.0–100.0)
Monocytes Absolute: 1.4 10*3/uL — ABNORMAL HIGH (ref 0.1–1.0)
Monocytes Relative: 10 %
Neutro Abs: 10.9 10*3/uL — ABNORMAL HIGH (ref 1.7–7.7)
Neutrophils Relative %: 77 %
Platelets: 340 10*3/uL (ref 150–400)
RBC: 4.93 MIL/uL (ref 4.22–5.81)
RDW: 13.6 % (ref 11.5–15.5)
WBC: 14.1 10*3/uL — ABNORMAL HIGH (ref 4.0–10.5)
nRBC: 0 % (ref 0.0–0.2)

## 2020-01-12 LAB — BASIC METABOLIC PANEL
Anion gap: 8 (ref 5–15)
BUN: 22 mg/dL — ABNORMAL HIGH (ref 6–20)
CO2: 25 mmol/L (ref 22–32)
Calcium: 8.5 mg/dL — ABNORMAL LOW (ref 8.9–10.3)
Chloride: 104 mmol/L (ref 98–111)
Creatinine, Ser: 1.43 mg/dL — ABNORMAL HIGH (ref 0.61–1.24)
GFR, Estimated: 56 mL/min — ABNORMAL LOW (ref >60)
Glucose, Bld: 189 mg/dL — ABNORMAL HIGH (ref 70–99)
Potassium: 3.8 mmol/L (ref 3.5–5.1)
Sodium: 137 mmol/L (ref 135–145)

## 2020-01-12 MED ORDER — FAMOTIDINE IN NACL 20-0.9 MG/50ML-% IV SOLN
20.0000 mg | Freq: Once | INTRAVENOUS | Status: AC
  Administered 2020-01-12: 20 mg via INTRAVENOUS
  Filled 2020-01-12: qty 50

## 2020-01-12 MED ORDER — PREDNISONE 10 MG PO TABS: 20.0000 mg | ORAL_TABLET | Freq: Two times a day (BID) | ORAL | 0 refills | Status: DC

## 2020-01-12 MED ORDER — EPINEPHRINE 0.3 MG/0.3ML IJ SOAJ
INTRAMUSCULAR | Status: AC
  Administered 2020-01-12: 0.3 mg
  Filled 2020-01-12: qty 0.3

## 2020-01-12 MED ORDER — EPINEPHRINE 0.3 MG/0.3ML IJ SOAJ: 0.3000 mg | INTRAMUSCULAR | 0 refills | Status: DC | PRN

## 2020-01-12 MED ORDER — METHYLPREDNISOLONE SODIUM SUCC 125 MG IJ SOLR
125.0000 mg | Freq: Once | INTRAMUSCULAR | Status: AC
  Administered 2020-01-12: 125 mg via INTRAVENOUS
  Filled 2020-01-12: qty 2

## 2020-01-12 MED ORDER — SODIUM CHLORIDE 0.9 % IV BOLUS
1000.0000 mL | Freq: Once | INTRAVENOUS | Status: AC
  Administered 2020-01-12: 1000 mL via INTRAVENOUS

## 2020-01-12 MED ORDER — DIPHENHYDRAMINE HCL 50 MG/ML IJ SOLN
25.0000 mg | Freq: Once | INTRAMUSCULAR | Status: AC
  Administered 2020-01-12: 25 mg via INTRAVENOUS
  Filled 2020-01-12: qty 1

## 2020-01-12 NOTE — ED Provider Notes (Signed)
[facility]-EMERGENCY DEPT Provider Note   CSN: [id number] Arrival date & time: 01/12/20  1825     History Chief Complaint  Patient presents with  . Allergic Reaction    Steve Wright is a 59 y.o. male.  Patient is a 59 year old male with no significant past medical history.  He presents today for evaluation of an allergic reaction.  Patient was working in the yard spreading mulch, then went inside and noticed he was itching all over.  He noticed a rash in his groin that has since spread to his arms, legs, torso, and face.  He is having swelling of his lips and face, but denies any swelling of his throat.  He denies any other new contacts or exposures.  He denies any difficulty breathing or chest pain.  The history is provided by the patient.  Allergic Reaction Presenting symptoms: itching and rash   Severity:  Moderate Duration:  2 hours Prior allergic episodes:  No prior episodes Relieved by:  Nothing Worsened by:  Nothing Ineffective treatments:  None tried      No past medical history on file.  There are no problems to display for this patient.   No past surgical history on file.     Family History  Problem Relation Age of Onset  . Heart disease Father     Social History   Tobacco Use  . Smoking status: Current Every Day Smoker    Packs/day: 1.00    Years: 34.00    Pack years: 34.00    Types: Cigarettes  . Smokeless tobacco: Never Used  Vaping Use  . Vaping Use: Never used  Substance Use Topics  . Alcohol use: Yes    Alcohol/week: 60.0 standard drinks    Types: 30 Cans of beer, 30 Shots of liquor per week  . Drug use: Yes    Types: "Crack" cocaine, Marijuana, Cocaine    Home Medications Prior to Admission medications   Medication Sig Start Date End Date Taking? Authorizing Provider  predniSONE (DELTASONE) 20 MG tablet Take 3 PO QAM x3days, 2 PO QAM x3days, 1 PO QAM x3days 12/31/17   Barnett Abu, [place] D, PA-C  valACYclovir  (VALTREX) 1000 MG tablet Take 2 tablets (2,000 mg total) by mouth 2 (two) times daily. For one day with each outbreak 08/27/16   Doristine Bosworth, MD    Allergies    Patient has no known allergies.  Review of Systems   Review of Systems  Skin: Positive for itching and rash.  All other systems reviewed and are negative.   Physical Exam Updated Vital Signs There were no vitals taken for this visit.  Physical Exam Vitals and nursing note reviewed.  Constitutional:      General: He is not in acute distress.    Appearance: He is well-developed. He is not diaphoretic.  HENT:     Head: Normocephalic and atraumatic.     Mouth/Throat:     Pharynx: No oropharyngeal exudate or posterior oropharyngeal erythema.     Comments: There is swelling of the face and lips, but no apparent intraoral swelling.  There is no stridor. Cardiovascular:     Rate and Rhythm: Normal rate and regular rhythm.     Heart sounds: No murmur heard.  No friction rub.  Pulmonary:     Effort: Pulmonary effort is normal. No respiratory distress.     Breath sounds: Normal breath sounds. No wheezing or rales.  Abdominal:     General: Bowel  sounds are normal. There is no distension.     Palpations: Abdomen is soft.     Tenderness: There is no abdominal tenderness.  Musculoskeletal:        General: Normal range of motion.     Cervical back: Normal range of motion and neck supple.  Skin:    General: Skin is warm and dry.  Neurological:     Mental Status: He is alert and oriented to person, place, and time.     Coordination: Coordination normal.     ED Results / Procedures / Treatments   Labs (all labs ordered are listed, but only abnormal results are displayed) Labs Reviewed  BASIC METABOLIC PANEL  CBC WITH DIFFERENTIAL/PLATELET    EKG None  Radiology No results found.  Procedures Procedures (including critical care time)  Medications Ordered in ED Medications  diphenhydrAMINE (BENADRYL) injection  25 mg (has no administration in time range)  methylPREDNISolone sodium succinate (SOLU-MEDROL) 125 mg/2 mL injection 125 mg (has no administration in time range)  famotidine (PEPCID) IVPB 20 mg premix (has no administration in time range)  sodium chloride 0.9 % bolus 1,000 mL (has no administration in time range)  EPINEPHrine (EPI-PEN) 0.3 mg/0.3 mL injection (has no administration in time range)    ED Course  I have reviewed the triage vital signs and the nursing notes.  Pertinent labs & imaging results that were available during my care of the patient were reviewed by me and considered in my medical decision making (see chart for details).    MDM Rules/Calculators/A&P  Patient presenting with generalized rash with swelling over his upper and lower lip.  This started in the absence of any known exposure.  Patient was spreading mulch in the yard shortly before symptom onset and I suspect this to be the cause.  Patient given Solu-Medrol, Benadryl, and Pepcid as well as an EpiPen injection.  He has been observed for several hour during which time his rash has significantly improved, the swelling of his lips is improved, and his vitals have remained stable.  Patient seems appropriate for discharge.  He lives less than a mile from the facility and if symptoms worsen, he will return.  He will be discharged with prednisone, Benadryl, and EpiPen.  Final Clinical Impression(s) / ED Diagnoses Final diagnoses:  None    Rx / DC Orders ED Discharge Orders    None       Geoffery Lyons, MD 01/12/20 2139

## 2020-01-12 NOTE — Discharge Instructions (Signed)
Begin taking prednisone as prescribed.  Take Benadryl 25 mg every 6 hours for the next 3 days.  Use the EpiPen you were prescribed if you develop swelling of your throat, difficulty breathing, or dizziness, then return to the ER to be evaluated.

## 2020-01-12 NOTE — ED Triage Notes (Signed)
Pt presents with facial swelling and hives resulting from a possible allergic reaction. Pt unaware what could have caused the reaction. Pt took 2 benadryl at home prior to coming. Airway patent and pt appears to not be in respiratory distress.

## 2021-11-30 ENCOUNTER — Other Ambulatory Visit: Payer: Self-pay

## 2021-11-30 ENCOUNTER — Emergency Department (HOSPITAL_COMMUNITY)
Admission: EM | Admit: 2021-11-30 | Discharge: 2021-11-30 | Disposition: A | Discharge status: Home / Self Care | Payer: Commercial Managed Care - PPO | Attending: Emergency Medicine | Admitting: Emergency Medicine

## 2021-11-30 ENCOUNTER — Encounter (HOSPITAL_COMMUNITY): Payer: Self-pay

## 2021-11-30 DIAGNOSIS — H5713 Ocular pain, bilateral: Secondary | ICD-10-CM | POA: Diagnosis present

## 2021-11-30 DIAGNOSIS — Z5321 Procedure and treatment not carried out due to patient leaving prior to being seen by health care provider: Secondary | ICD-10-CM | POA: Insufficient documentation

## 2021-11-30 NOTE — ED Notes (Signed)
Pt was seen walking outside and not returning

## 2021-11-30 NOTE — ED Triage Notes (Signed)
Ambulatory to ED with c/o eye pain. States he has a "fine grit" in both eyes and cannot see out of either.

## 2021-11-30 NOTE — ED Notes (Signed)
Pt getting intermittent eye irrigation while in the lobby

## 2022-09-02 ENCOUNTER — Inpatient Hospital Stay (HOSPITAL_COMMUNITY): Payer: Commercial Managed Care - PPO | Admitting: Anesthesiology

## 2022-09-02 ENCOUNTER — Other Ambulatory Visit: Payer: Self-pay

## 2022-09-02 ENCOUNTER — Inpatient Hospital Stay (HOSPITAL_COMMUNITY): Payer: Commercial Managed Care - PPO

## 2022-09-02 ENCOUNTER — Emergency Department (HOSPITAL_COMMUNITY): Payer: Commercial Managed Care - PPO

## 2022-09-02 ENCOUNTER — Encounter (HOSPITAL_COMMUNITY)
Admission: EM | Disposition: A | Discharge status: Home Health | Payer: Self-pay | Source: Home / Self Care | Attending: Orthopedic Surgery

## 2022-09-02 ENCOUNTER — Inpatient Hospital Stay (HOSPITAL_COMMUNITY)
Admission: EM | Admit: 2022-09-02 | Discharge: 2022-09-03 | DRG: 482 | Disposition: A | Discharge status: Home Health | Payer: Commercial Managed Care - PPO | Attending: Orthopedic Surgery | Admitting: Orthopedic Surgery

## 2022-09-02 ENCOUNTER — Encounter (HOSPITAL_COMMUNITY): Payer: Self-pay

## 2022-09-02 DIAGNOSIS — F1490 Cocaine use, unspecified, uncomplicated: Secondary | ICD-10-CM | POA: Diagnosis present

## 2022-09-02 DIAGNOSIS — Z8249 Family history of ischemic heart disease and other diseases of the circulatory system: Secondary | ICD-10-CM

## 2022-09-02 DIAGNOSIS — Y9351 Activity, roller skating (inline) and skateboarding: Secondary | ICD-10-CM | POA: Diagnosis not present

## 2022-09-02 DIAGNOSIS — F1090 Alcohol use, unspecified, uncomplicated: Secondary | ICD-10-CM | POA: Diagnosis present

## 2022-09-02 DIAGNOSIS — Z7952 Long term (current) use of systemic steroids: Secondary | ICD-10-CM | POA: Diagnosis not present

## 2022-09-02 DIAGNOSIS — F17210 Nicotine dependence, cigarettes, uncomplicated: Secondary | ICD-10-CM

## 2022-09-02 DIAGNOSIS — S7292XA Unspecified fracture of left femur, initial encounter for closed fracture: Principal | ICD-10-CM

## 2022-09-02 DIAGNOSIS — F1290 Cannabis use, unspecified, uncomplicated: Secondary | ICD-10-CM | POA: Diagnosis present

## 2022-09-02 DIAGNOSIS — S72142A Displaced intertrochanteric fracture of left femur, initial encounter for closed fracture: Principal | ICD-10-CM | POA: Diagnosis present

## 2022-09-02 DIAGNOSIS — Y929 Unspecified place or not applicable: Secondary | ICD-10-CM | POA: Diagnosis not present

## 2022-09-02 HISTORY — PX: HIP SURGERY: SHX245

## 2022-09-02 HISTORY — PX: INTRAMEDULLARY (IM) NAIL INTERTROCHANTERIC: SHX5875

## 2022-09-02 LAB — CBC WITH DIFFERENTIAL/PLATELET
Abs Immature Granulocytes: 0.05 10*3/uL (ref 0.00–0.07)
Basophils Absolute: 0.1 10*3/uL (ref 0.0–0.1)
Basophils Relative: 1 %
Eosinophils Absolute: 0 10*3/uL (ref 0.0–0.5)
Eosinophils Relative: 0 %
HCT: 39.9 % (ref 39.0–52.0)
Hemoglobin: 13.3 g/dL (ref 13.0–17.0)
Immature Granulocytes: 0 %
Lymphocytes Relative: 5 %
Lymphs Abs: 0.6 10*3/uL — ABNORMAL LOW (ref 0.7–4.0)
MCH: 30 pg (ref 26.0–34.0)
MCHC: 33.3 g/dL (ref 30.0–36.0)
MCV: 89.9 fL (ref 80.0–100.0)
Monocytes Absolute: 1.2 10*3/uL — ABNORMAL HIGH (ref 0.1–1.0)
Monocytes Relative: 11 %
Neutro Abs: 9.4 10*3/uL — ABNORMAL HIGH (ref 1.7–7.7)
Neutrophils Relative %: 83 %
Platelets: 257 10*3/uL (ref 150–400)
RBC: 4.44 MIL/uL (ref 4.22–5.81)
RDW: 14.2 % (ref 11.5–15.5)
WBC: 11.4 10*3/uL — ABNORMAL HIGH (ref 4.0–10.5)
nRBC: 0 % (ref 0.0–0.2)

## 2022-09-02 LAB — BASIC METABOLIC PANEL
Anion gap: 8 (ref 5–15)
BUN: 16 mg/dL (ref 8–23)
CO2: 23 mmol/L (ref 22–32)
Calcium: 8.5 mg/dL — ABNORMAL LOW (ref 8.9–10.3)
Chloride: 104 mmol/L (ref 98–111)
Creatinine, Ser: 0.73 mg/dL (ref 0.61–1.24)
GFR, Estimated: >60 mL/min (ref >60)
Glucose, Bld: 124 mg/dL — ABNORMAL HIGH (ref 70–99)
Potassium: 3.9 mmol/L (ref 3.5–5.1)
Sodium: 135 mmol/L (ref 135–145)

## 2022-09-02 LAB — TYPE AND SCREEN
ABO/RH(D): B POS
Antibody Screen: NEGATIVE

## 2022-09-02 LAB — ABO/RH: ABO/RH(D): B POS

## 2022-09-02 SURGERY — FIXATION, FRACTURE, INTERTROCHANTERIC, WITH INTRAMEDULLARY ROD
Anesthesia: General | Laterality: Left

## 2022-09-02 MED ORDER — FERROUS SULFATE 325 (65 FE) MG PO TABS
325.0000 mg | ORAL_TABLET | Freq: Three times a day (TID) | ORAL | Status: DC
  Administered 2022-09-03 (×2): 325 mg via ORAL
  Filled 2022-09-02 (×2): qty 1

## 2022-09-02 MED ORDER — CEFAZOLIN SODIUM-DEXTROSE 2-4 GM/100ML-% IV SOLN
INTRAVENOUS | Status: AC
  Filled 2022-09-02: qty 100

## 2022-09-02 MED ORDER — TRANEXAMIC ACID-NACL 1000-0.7 MG/100ML-% IV SOLN
1000.0000 mg | INTRAVENOUS | Status: AC
  Administered 2022-09-02: 1000 mg via INTRAVENOUS
  Filled 2022-09-02: qty 100

## 2022-09-02 MED ORDER — ADULT MULTIVITAMIN W/MINERALS CH
1.0000 | ORAL_TABLET | Freq: Every day | ORAL | Status: DC
  Administered 2022-09-03: 1 via ORAL
  Filled 2022-09-02: qty 1

## 2022-09-02 MED ORDER — LACTATED RINGERS IV SOLN: INTRAVENOUS | Status: DC

## 2022-09-02 MED ORDER — HYDROMORPHONE HCL 1 MG/ML IJ SOLN: 0.5000 mg | INTRAMUSCULAR | Status: DC | PRN

## 2022-09-02 MED ORDER — ENOXAPARIN SODIUM 40 MG/0.4ML IJ SOSY
40.0000 mg | PREFILLED_SYRINGE | INTRAMUSCULAR | Status: DC
  Administered 2022-09-03: 40 mg via SUBCUTANEOUS
  Filled 2022-09-02: qty 0.4

## 2022-09-02 MED ORDER — MENTHOL 3 MG MT LOZG: 1.0000 | LOZENGE | OROMUCOSAL | Status: DC | PRN

## 2022-09-02 MED ORDER — METHOCARBAMOL 500 MG PO TABS: 500.0000 mg | ORAL_TABLET | Freq: Four times a day (QID) | ORAL | Status: DC | PRN

## 2022-09-02 MED ORDER — POLYETHYLENE GLYCOL 3350 17 G PO PACK: 17.0000 g | PACK | Freq: Every day | ORAL | Status: DC | PRN

## 2022-09-02 MED ORDER — OXYCODONE HCL 5 MG/5ML PO SOLN: 5.0000 mg | Freq: Once | ORAL | Status: DC | PRN

## 2022-09-02 MED ORDER — PHENYLEPHRINE HCL-NACL 20-0.9 MG/250ML-% IV SOLN
INTRAVENOUS | Status: AC
  Filled 2022-09-02: qty 250

## 2022-09-02 MED ORDER — THIAMINE HCL 100 MG/ML IJ SOLN: 100.0000 mg | Freq: Every day | INTRAMUSCULAR | Status: DC

## 2022-09-02 MED ORDER — FENTANYL CITRATE (PF) 250 MCG/5ML IJ SOLN
INTRAMUSCULAR | Status: AC
  Filled 2022-09-02: qty 5

## 2022-09-02 MED ORDER — FENTANYL CITRATE (PF) 100 MCG/2ML IJ SOLN
INTRAMUSCULAR | Status: DC | PRN
  Administered 2022-09-02: 25 ug via INTRAVENOUS
  Administered 2022-09-02: 100 ug via INTRAVENOUS
  Administered 2022-09-02: 25 ug via INTRAVENOUS

## 2022-09-02 MED ORDER — METHOCARBAMOL 500 MG PO TABS
500.0000 mg | ORAL_TABLET | Freq: Four times a day (QID) | ORAL | Status: DC | PRN
  Administered 2022-09-03 (×2): 500 mg via ORAL
  Filled 2022-09-02 (×2): qty 1

## 2022-09-02 MED ORDER — ONDANSETRON HCL 4 MG/2ML IJ SOLN
INTRAMUSCULAR | Status: DC | PRN
  Administered 2022-09-02: 4 mg via INTRAVENOUS

## 2022-09-02 MED ORDER — MORPHINE SULFATE (PF) 2 MG/ML IV SOLN: 0.5000 mg | INTRAVENOUS | Status: DC | PRN

## 2022-09-02 MED ORDER — METHOCARBAMOL 1000 MG/10ML IJ SOLN: 500.0000 mg | Freq: Four times a day (QID) | INTRAVENOUS | Status: DC | PRN

## 2022-09-02 MED ORDER — EPHEDRINE SULFATE-NACL 50-0.9 MG/10ML-% IV SOSY
PREFILLED_SYRINGE | INTRAVENOUS | Status: DC | PRN
  Administered 2022-09-02 (×2): 10 mg via INTRAVENOUS

## 2022-09-02 MED ORDER — CHLORHEXIDINE GLUCONATE 0.12 % MT SOLN
15.0000 mL | Freq: Once | OROMUCOSAL | Status: AC
  Administered 2022-09-02: 15 mL via OROMUCOSAL

## 2022-09-02 MED ORDER — LIDOCAINE 2% (20 MG/ML) 5 ML SYRINGE
INTRAMUSCULAR | Status: DC | PRN
  Administered 2022-09-02: 70 mg via INTRAVENOUS

## 2022-09-02 MED ORDER — SENNA 8.6 MG PO TABS: 1.0000 | ORAL_TABLET | Freq: Two times a day (BID) | ORAL | Status: DC

## 2022-09-02 MED ORDER — OXYCODONE HCL 5 MG PO TABS
5.0000 mg | ORAL_TABLET | ORAL | Status: DC | PRN
  Administered 2022-09-03: 10 mg via ORAL
  Administered 2022-09-03 (×2): 5 mg via ORAL
  Filled 2022-09-02: qty 2
  Filled 2022-09-02: qty 1
  Filled 2022-09-02: qty 2

## 2022-09-02 MED ORDER — BISACODYL 10 MG RE SUPP: 10.0000 mg | Freq: Every day | RECTAL | Status: DC | PRN

## 2022-09-02 MED ORDER — PROMETHAZINE HCL 25 MG/ML IJ SOLN: 6.2500 mg | INTRAMUSCULAR | Status: DC | PRN

## 2022-09-02 MED ORDER — DOCUSATE SODIUM 100 MG PO CAPS: 100.0000 mg | ORAL_CAPSULE | Freq: Two times a day (BID) | ORAL | Status: DC

## 2022-09-02 MED ORDER — OXYCODONE HCL 5 MG PO TABS: 10.0000 mg | ORAL_TABLET | ORAL | Status: DC | PRN

## 2022-09-02 MED ORDER — ACETAMINOPHEN 10 MG/ML IV SOLN
INTRAVENOUS | Status: DC | PRN
  Administered 2022-09-02: 1000 mg via INTRAVENOUS

## 2022-09-02 MED ORDER — MIDAZOLAM HCL 2 MG/2ML IJ SOLN
INTRAMUSCULAR | Status: AC
  Filled 2022-09-02: qty 2

## 2022-09-02 MED ORDER — CEFAZOLIN SODIUM-DEXTROSE 2-4 GM/100ML-% IV SOLN
2.0000 g | Freq: Four times a day (QID) | INTRAVENOUS | Status: AC
  Administered 2022-09-02 – 2022-09-03 (×2): 2 g via INTRAVENOUS
  Filled 2022-09-02 (×2): qty 100

## 2022-09-02 MED ORDER — TRANEXAMIC ACID-NACL 1000-0.7 MG/100ML-% IV SOLN
INTRAVENOUS | Status: AC
  Filled 2022-09-02: qty 100

## 2022-09-02 MED ORDER — BISACODYL 5 MG PO TBEC: 5.0000 mg | DELAYED_RELEASE_TABLET | Freq: Every day | ORAL | Status: DC | PRN

## 2022-09-02 MED ORDER — PROPOFOL 10 MG/ML IV BOLUS
INTRAVENOUS | Status: DC | PRN
  Administered 2022-09-02: 170 mg via INTRAVENOUS

## 2022-09-02 MED ORDER — FOLIC ACID 1 MG PO TABS
1.0000 mg | ORAL_TABLET | Freq: Every day | ORAL | Status: DC
  Administered 2022-09-03: 1 mg via ORAL
  Filled 2022-09-02: qty 1

## 2022-09-02 MED ORDER — POVIDONE-IODINE 10 % EX SWAB
2.0000 | Freq: Once | CUTANEOUS | Status: AC
  Administered 2022-09-02: 2 via TOPICAL

## 2022-09-02 MED ORDER — FENTANYL CITRATE PF 50 MCG/ML IJ SOSY
25.0000 ug | PREFILLED_SYRINGE | INTRAMUSCULAR | Status: DC | PRN
  Administered 2022-09-02: 50 ug via INTRAVENOUS

## 2022-09-02 MED ORDER — POTASSIUM CHLORIDE IN NACL 20-0.9 MEQ/L-% IV SOLN
INTRAVENOUS | Status: DC
  Filled 2022-09-02: qty 1000

## 2022-09-02 MED ORDER — FENTANYL CITRATE PF 50 MCG/ML IJ SOSY
PREFILLED_SYRINGE | INTRAMUSCULAR | Status: AC
  Filled 2022-09-02: qty 1

## 2022-09-02 MED ORDER — SENNA 8.6 MG PO TABS
1.0000 | ORAL_TABLET | Freq: Two times a day (BID) | ORAL | Status: DC
  Administered 2022-09-03: 8.6 mg via ORAL
  Filled 2022-09-02: qty 1

## 2022-09-02 MED ORDER — KETOROLAC TROMETHAMINE 30 MG/ML IJ SOLN
INTRAMUSCULAR | Status: DC | PRN
  Administered 2022-09-02: 30 mg via INTRAVENOUS

## 2022-09-02 MED ORDER — LORAZEPAM 1 MG PO TABS: 1.0000 mg | ORAL_TABLET | ORAL | Status: DC | PRN

## 2022-09-02 MED ORDER — PHENOL 1.4 % MT LIQD: 1.0000 | OROMUCOSAL | Status: DC | PRN

## 2022-09-02 MED ORDER — CEFAZOLIN SODIUM-DEXTROSE 2-4 GM/100ML-% IV SOLN
2.0000 g | INTRAVENOUS | Status: AC
  Administered 2022-09-02: 2 g via INTRAVENOUS
  Filled 2022-09-02: qty 100

## 2022-09-02 MED ORDER — SUGAMMADEX SODIUM 200 MG/2ML IV SOLN
INTRAVENOUS | Status: DC | PRN
  Administered 2022-09-02: 200 mg via INTRAVENOUS

## 2022-09-02 MED ORDER — ALUM & MAG HYDROXIDE-SIMETH 200-200-20 MG/5ML PO SUSP: 30.0000 mL | ORAL | Status: DC | PRN

## 2022-09-02 MED ORDER — ONDANSETRON HCL 4 MG/2ML IJ SOLN: 4.0000 mg | Freq: Four times a day (QID) | INTRAMUSCULAR | Status: DC | PRN

## 2022-09-02 MED ORDER — MAGNESIUM CITRATE PO SOLN
1.0000 | Freq: Once | ORAL | Status: DC | PRN
  Filled 2022-09-02: qty 296

## 2022-09-02 MED ORDER — FENTANYL CITRATE PF 50 MCG/ML IJ SOSY
50.0000 ug | PREFILLED_SYRINGE | INTRAMUSCULAR | Status: DC | PRN
  Administered 2022-09-02: 50 ug via INTRAVENOUS
  Filled 2022-09-02: qty 1

## 2022-09-02 MED ORDER — POTASSIUM CHLORIDE IN NACL 20-0.9 MEQ/L-% IV SOLN
INTRAVENOUS | Status: DC
  Filled 2022-09-02 (×2): qty 1000

## 2022-09-02 MED ORDER — HYDROCODONE-ACETAMINOPHEN 5-325 MG PO TABS: 1.0000 | ORAL_TABLET | Freq: Four times a day (QID) | ORAL | Status: DC | PRN

## 2022-09-02 MED ORDER — DEXMEDETOMIDINE HCL IN NACL 200 MCG/50ML IV SOLN
INTRAVENOUS | Status: DC | PRN
  Administered 2022-09-02: 8 ug via INTRAVENOUS

## 2022-09-02 MED ORDER — TRANEXAMIC ACID-NACL 1000-0.7 MG/100ML-% IV SOLN
1000.0000 mg | Freq: Once | INTRAVENOUS | Status: AC
  Administered 2022-09-02: 1000 mg via INTRAVENOUS
  Filled 2022-09-02: qty 100

## 2022-09-02 MED ORDER — OXYCODONE HCL 5 MG PO TABS: 5.0000 mg | ORAL_TABLET | Freq: Once | ORAL | Status: DC | PRN

## 2022-09-02 MED ORDER — ACETAMINOPHEN 500 MG PO TABS
1000.0000 mg | ORAL_TABLET | Freq: Four times a day (QID) | ORAL | Status: DC
  Administered 2022-09-03 (×2): 1000 mg via ORAL
  Filled 2022-09-02 (×2): qty 2

## 2022-09-02 MED ORDER — ACETAMINOPHEN 10 MG/ML IV SOLN
INTRAVENOUS | Status: AC
  Filled 2022-09-02: qty 100

## 2022-09-02 MED ORDER — ROCURONIUM BROMIDE 10 MG/ML (PF) SYRINGE
PREFILLED_SYRINGE | INTRAVENOUS | Status: DC | PRN
  Administered 2022-09-02: 50 mg via INTRAVENOUS

## 2022-09-02 MED ORDER — ACETAMINOPHEN 325 MG PO TABS: 325.0000 mg | ORAL_TABLET | Freq: Four times a day (QID) | ORAL | Status: DC | PRN

## 2022-09-02 MED ORDER — ZOLPIDEM TARTRATE 5 MG PO TABS: 5.0000 mg | ORAL_TABLET | Freq: Every evening | ORAL | Status: DC | PRN

## 2022-09-02 MED ORDER — MIDAZOLAM HCL 5 MG/5ML IJ SOLN
INTRAMUSCULAR | Status: DC | PRN
  Administered 2022-09-02: 2 mg via INTRAVENOUS

## 2022-09-02 MED ORDER — ONDANSETRON HCL 4 MG PO TABS: 4.0000 mg | ORAL_TABLET | Freq: Four times a day (QID) | ORAL | Status: DC | PRN

## 2022-09-02 MED ORDER — THIAMINE MONONITRATE 100 MG PO TABS
100.0000 mg | ORAL_TABLET | Freq: Every day | ORAL | Status: DC
  Administered 2022-09-03: 100 mg via ORAL
  Filled 2022-09-02: qty 1

## 2022-09-02 MED ORDER — DEXAMETHASONE SODIUM PHOSPHATE 10 MG/ML IJ SOLN
INTRAMUSCULAR | Status: DC | PRN
  Administered 2022-09-02: 8 mg via INTRAVENOUS

## 2022-09-02 MED ORDER — MAGNESIUM CITRATE PO SOLN: 1.0000 | Freq: Once | ORAL | Status: DC | PRN

## 2022-09-02 MED ORDER — 0.9 % SODIUM CHLORIDE (POUR BTL) OPTIME
TOPICAL | Status: DC | PRN
  Administered 2022-09-02: 1000 mL

## 2022-09-02 MED ORDER — PHENYLEPHRINE HCL-NACL 20-0.9 MG/250ML-% IV SOLN
INTRAVENOUS | Status: DC | PRN
  Administered 2022-09-02: 50 ug/min via INTRAVENOUS

## 2022-09-02 SURGICAL SUPPLY — 47 items
ADH SKN CLS APL DERMABOND .7 (GAUZE/BANDAGES/DRESSINGS) ×1
APL SKNCLS STERI-STRIP NONHPOA (GAUZE/BANDAGES/DRESSINGS)
BAG COUNTER SPONGE SURGICOUNT (BAG) ×1 IMPLANT
BAG SPNG CNTER NS LX DISP (BAG) ×1
BENZOIN TINCTURE PRP APPL 2/3 (GAUZE/BANDAGES/DRESSINGS) IMPLANT
BIT DRILL CANN LG 4.3MM (BIT) IMPLANT
BNDG CMPR 5X6 CHSV STRCH STRL (GAUZE/BANDAGES/DRESSINGS) ×1
BNDG COHESIVE 6X5 TAN ST LF (GAUZE/BANDAGES/DRESSINGS) ×1 IMPLANT
CLSR STERI-STRIP ANTIMIC 1/2X4 (GAUZE/BANDAGES/DRESSINGS) IMPLANT
COVER PERINEAL POST (MISCELLANEOUS) ×1 IMPLANT
COVER SURGICAL LIGHT HANDLE (MISCELLANEOUS) ×1 IMPLANT
DERMABOND ADVANCED .7 DNX12 (GAUZE/BANDAGES/DRESSINGS) IMPLANT
DRAPE C-ARM 42X120 X-RAY (DRAPES) ×1 IMPLANT
DRAPE IMP U-DRAPE 54X76 (DRAPES) ×2 IMPLANT
DRAPE INCISE IOBAN 66X45 STRL (DRAPES) IMPLANT
DRAPE STERI IOBAN 125X83 (DRAPES) ×1 IMPLANT
DRAPE U-SHAPE 47X51 STRL (DRAPES) ×2 IMPLANT
DRESSING MEPILEX FLEX 4X4 (GAUZE/BANDAGES/DRESSINGS) ×2 IMPLANT
DRILL BIT CANN LG 4.3MM (BIT) ×1
DRIVER HEX DIST SCRW 3.5 (ORTHOPEDIC DISPOSABLE SUPPLIES) IMPLANT
DRSG MEPILEX FLEX 4X4 (GAUZE/BANDAGES/DRESSINGS) ×2
DRSG MEPILEX POST OP 4X8 (GAUZE/BANDAGES/DRESSINGS) IMPLANT
DRSG MEPITEL 4X7.2 (GAUZE/BANDAGES/DRESSINGS) IMPLANT
DURAPREP 26ML APPLICATOR (WOUND CARE) ×1 IMPLANT
ELECT REM PT RETURN 15FT ADLT (MISCELLANEOUS) ×1 IMPLANT
GAUZE XEROFORM 5X9 LF (GAUZE/BANDAGES/DRESSINGS) ×1 IMPLANT
GLOVE BIO SURGEON STRL SZ 6.5 (GLOVE) ×1 IMPLANT
GLOVE BIOGEL PI IND STRL 7.0 (GLOVE) ×1 IMPLANT
GLOVE INDICATOR 8.0 STRL GRN (GLOVE) ×1 IMPLANT
GLOVE ORTHO TXT STRL SZ7.5 (GLOVE) ×1 IMPLANT
GOWN STRL SURGICAL XL XLNG (GOWN DISPOSABLE) ×2 IMPLANT
GUIDEPIN VERSANAIL DSP 3.2X444 (ORTHOPEDIC DISPOSABLE SUPPLIES) IMPLANT
KIT BASIN OR (CUSTOM PROCEDURE TRAY) ×1 IMPLANT
KIT TURNOVER KIT A (KITS) IMPLANT
MANIFOLD NEPTUNE II (INSTRUMENTS) ×1 IMPLANT
NAIL HIP FRACT 130D 9X180 (Orthopedic Implant) IMPLANT
NS IRRIG 1000ML POUR BTL (IV SOLUTION) ×1 IMPLANT
PACK GENERAL/GYN (CUSTOM PROCEDURE TRAY) ×1 IMPLANT
PAD ARMBOARD 7.5X6 YLW CONV (MISCELLANEOUS) ×2 IMPLANT
SCREW BONE CORTICAL 5.0X36 (Screw) IMPLANT
SCREW LAG 10.5MMX105MM HFN (Screw) IMPLANT
SUT VIC AB 0 CT1 27 (SUTURE) ×1
SUT VIC AB 0 CT1 27XBRD ANBCTR (SUTURE) ×1 IMPLANT
SUT VIC AB 3-0 SH 27 (SUTURE) ×2
SUT VIC AB 3-0 SH 27X BRD (SUTURE) ×2 IMPLANT
TOWEL OR 17X26 10 PK STRL BLUE (TOWEL DISPOSABLE) ×1 IMPLANT
WATER STERILE IRR 1000ML POUR (IV SOLUTION) ×2 IMPLANT

## 2022-09-02 NOTE — Anesthesia Procedure Notes (Signed)
Procedure Name: Intubation Date/Time: 09/02/2022 9:04 AM  Performed by: Ponciano Ort, CRNAPre-anesthesia Checklist: Patient identified, Emergency Drugs available, Suction available and Patient being monitored Patient Re-evaluated:Patient Re-evaluated prior to induction Oxygen Delivery Method: Circle system utilized Preoxygenation: Pre-oxygenation with 100% oxygen Induction Type: IV induction Ventilation: Mask ventilation without difficulty Laryngoscope Size: Mac and 3 Grade View: Grade I Tube type: Oral Tube size: 7.5 mm Number of attempts: 1 Airway Equipment and Method: Stylet and Oral airway Placement Confirmation: ETT inserted through vocal cords under direct vision, positive ETCO2 and breath sounds checked- equal and bilateral Secured at: 21 cm Tube secured with: Tape Dental Injury: Teeth and Oropharynx as per pre-operative assessment

## 2022-09-02 NOTE — H&P (Addendum)
PREOPERATIVE H&P  Chief Complaint: left hip pain  HPI: Steve Wright is a 62 y.o. male who denies any medical problems who presents to the [facility] emergency department complaining of a fall.  Yesterday he was skateboarding when he lost his balance fell directly onto his left hip he laid on the ground for about an hour before he was able to get help and assistance home.  He was unable to ambulate this morning and called EMS to be taken to the ED.  At this time he states pain is severe at the left hip and groin with movement but minimal with rest.  Denies pain elsewhere.  Denies numbness or tingling.  Denies hitting his head.  Does not walk with an assistive device at baseline.  Not on a blood thinner.  Denies chest pain, shortness of breath, nausea, vomiting or abdominal pain.  No other complaints.  He states he has no medical problems and does not take any medications.  He has not followed up with a primary care provider in "many years".  He he states he has not yet eaten today.    History reviewed. No pertinent past medical history. History reviewed. No pertinent surgical history. Social History   Socioeconomic History   Marital status: Divorced    Spouse name: Not on file   Number of children: 3   Years of education: Not on file   Highest education level: Not on file  Occupational History   Not on file  Tobacco Use   Smoking status: Every Day    Packs/day: 1.00    Years: 34.00    Additional pack years: 0.00    Total pack years: 34.00    Types: Cigarettes   Smokeless tobacco: Never  Vaping Use   Vaping Use: Never used  Substance and Sexual Activity   Alcohol use: Yes    Alcohol/week: 60.0 standard drinks of alcohol    Types: 30 Cans of beer, 30 Shots of liquor per week   Drug use: Yes    Types: "Crack" cocaine, Marijuana, Cocaine    Comment: last use 08/31/2022   Sexual activity: Not Currently  Other Topics Concern   Not on file  Social History Narrative   Not on file    Social Determinants of Health   Financial Resource Strain: Not on file  Food Insecurity: Not on file  Transportation Needs: Not on file  Physical Activity: Not on file  Stress: Not on file  Social Connections: Not on file   Family History  Problem Relation Age of Onset   Heart disease Father    No Known Allergies Prior to Admission medications   Medication Sig Start Date End Date Taking? Authorizing Provider  EPINEPHrine 0.3 mg/0.3 mL IJ SOAJ injection Inject 0.3 mg into the muscle as needed for anaphylaxis. 01/12/20   Geoffery Lyons, MD  predniSONE (DELTASONE) 10 MG tablet Take 2 tablets (20 mg total) by mouth 2 (two) times daily with a meal. 01/12/20   Geoffery Lyons, MD  valACYclovir (VALTREX) 1000 MG tablet Take 2 tablets (2,000 mg total) by mouth 2 (two) times daily. For one day with each outbreak 08/27/16   Doristine Bosworth, MD     Positive ROS: All other systems have been reviewed and were otherwise negative with the exception of those mentioned in the HPI and as above.  Physical Exam: General: Alert, no acute distress Cardiovascular: No pedal edema Respiratory: No cyanosis, no use of accessory musculature GI: No organomegaly, abdomen  is soft and non-tender Skin: Skin tears to anterior tibia and knee with dried blood. Neurologic: Sensation intact distally Psychiatric: Patient is competent for consent with normal mood and affect Lymphatic: No axillary or cervical lymphadenopathy  MUSCULOSKELETAL: Left hip nontender to touch.  Pain with any attempted movement at the left hip.  No tenderness palpation to left knee no effusion.  Dorsiflexion plantarflexion intact.  2+ DP pulse.  Sensation intact diffusely at the left foot.  Able to flex and extend all toes.  Imaging: X-rays taken of the left hip show a nondisplaced left intertrochanteric femur fracture.  Assessment/Plan: Left intertrochanteric femur fracture -This is an acute severe injury.  We will admit to our service  with plans for left hip IM nail.  - Risk benefits and alternatives of a left hip IM nail were discussed with the patient and he would like to move forward with surgery.  Patient has been n.p.o. today, I am currently trying to find options for OR timing here at [facility] or [facility].  Please keep n.p.o. in the meantime. - Continue bedrest  Addendum: will plan for IM nail here at [facility] later this evening, please keep NPO   Armida Sans, PA-C    09/02/2022 5:00 PM

## 2022-09-02 NOTE — Op Note (Signed)
DATE OF SURGERY:  09/02/2022  TIME: 7:48 PM  PATIENT NAME:  Steve Wright  AGE: 63 y.o.  PRE-OPERATIVE DIAGNOSIS:  LEFT HIP FRACTURE  POST-OPERATIVE DIAGNOSIS:  SAME  PROCEDURE:  INTRAMEDULLARY (IM) NAIL INTERTROCHANTERIC  SURGEON:  Eulas Post  ASSISTANT:  Janine Ores, PA-C, present and scrubbed throughout the case, critical for assistance with exposure, retraction, instrumentation, and closure.  OPERATIVE IMPLANTS:  Implant Name Type Inv. Item Serial No. Manufacturer Lot No. LRB No. Used Action  NAIL HIP FRACT 130D 9X180 - [id number] Orthopedic Implant NAIL HIP FRACT 130D 9X180  ZIMMER [employer]ON(ORTH,TRAU,BIO,SG) [id number] Left 1 Implanted  SCREW LAG 10.5MMX105MM HFN - [id number] Screw SCREW LAG 10.5MMX105MM HFN  ZIMMER RECON(ORTH,TRAU,BIO,SG) [id number] Left 1 Implanted  SCREW BONE CORTICAL 5.0X36 - [id number] Screw SCREW BONE CORTICAL 5.0X36  ZIMMER RECON(ORTH,TRAU,BIO,SG) [id number] Left 1 Implanted     UNIQUE ASPECTS OF THE CASE:  bone quality was reasonably good  ESTIMATED BLOOD LOSS: 100 ml  PREOPERATIVE INDICATIONS:  [name] is a 62 y.o. year old who fell skateboarding and suffered a hip fracture. He was brought into the ER and then admitted and optimized and then elected for surgical intervention.    The risks benefits and alternatives were discussed with the patient including but not limited to the risks of nonoperative treatment, versus surgical intervention including infection, bleeding, nerve injury, malunion, nonunion, hardware prominence, hardware failure, need for hardware removal, blood clots, cardiopulmonary complications, morbidity, mortality, among others, and they were willing to proceed.    OPERATIVE PROCEDURE:  The patient was brought to the operating room and placed in the supine position. Anesthesia was administered. He was placed on the fracture table.  Closed reduction was performed under C-arm guidance.  Time out was then performed after sterile  prep and drape. He received preoperative antibiotics.  Incision was made proximal to the greater trochanter. A guidewire was placed in the appropriate position. Confirmation was made on AP and lateral views.  The above-named nail was opened. I opened the proximal femur with a reamer. I then placed the nail by hand down. I did not need to ream the femur.  Once the nail was completely seated, I placed a guidepin into the femoral head into the center center position. I measured the length, and then reamed the lateral cortex and up into the head. I then placed the cephalomedullary screw. Anatomic fixation achieved.   I then secured the proximal interlocking bolt, and locked the nail distally using the jig.  I took final C-arm pictures AP and lateral.   Anatomic reconstruction was achieved, and the wounds were irrigated copiously and closed with Vicryl followed by Steri-Strips and sterile gauze for the skin. The patient was awakened and returned to PACU in stable and satisfactory condition. There were no complications and the patient tolerated the procedure well.  He will be weightbearing as tolerated, and will be on chemoprophylaxis for a period of four weeks after discharge.   Teryl Lucy, M.D.

## 2022-09-02 NOTE — Transfer of Care (Signed)
Immediate Anesthesia Transfer of Care Note  Patient: Steve Wright  Procedure(s) Performed: INTRAMEDULLARY (IM) NAIL INTERTROCHANTERIC (Left)  Patient Location: PACU  Anesthesia Type:General  Level of Consciousness: awake, alert , oriented, and patient cooperative  Airway & Oxygen Therapy: Patient Spontanous Breathing and Patient connected to face mask oxygen  Post-op Assessment: Report given to RN and Post -op Vital signs reviewed and stable  Post vital signs: Reviewed and stable  Last Vitals:  Vitals Value Taken Time  BP 121/88 09/02/22 2016  Temp    Pulse 66 09/02/22 2020  Resp 9 09/02/22 2020  SpO2 100 % 09/02/22 2020  Vitals shown include unvalidated device data.  Last Pain:  Vitals:   09/02/22 1834  TempSrc:   PainSc: 4       Patients Stated Pain Goal: 0 (09/02/22 1834)  Complications: No notable events documented.

## 2022-09-02 NOTE — ED Notes (Signed)
ED TO INPATIENT HANDOFF REPORT  ED Nurse Name and Phone #:  Thurmon Fair. Manson Passey, RN [phone]  S Name/Age/Gender Dorian Furnace 62 y.o. male Room/Bed: WA06/WA06  Code Status   Code Status: Full Code  Home/SNF/Other Home Patient oriented to: self, place, time, and situation Is this baseline? Yes   Triage Complete: Triage complete  Chief Complaint Closed intertrochanteric fracture of hip, left, initial encounter [facility]) [S72.142A]  Triage Note Pt BIB [facility] from home. Pt fell off skate board last PM and crawled into house. Pt waited to this AM to call EMS. Pt with severe pain to the inner L hip.    EMS Vitals  112/74 HR 94 RR 18 SpO2 95% on R/A CBG 120   Allergies No Known Allergies  Level of Care/Admitting Diagnosis ED Disposition     ED Disposition  Admit   Condition  --   Comment  Hospital Area: [facility] [[id number]]  Level of Care: Med-Surg [16]  May admit patient to [facility] or [facility] if equivalent level of care is available:: No  Covid Evaluation: Asymptomatic - no recent exposure (last 10 days) testing not required  Diagnosis: Closed intertrochanteric fracture of hip, left, initial encounter [facility]) [[id number]]  Admitting Physician: Teryl Lucy [[id number]]  Attending Physician: Teryl Lucy [[id number]]  Certification:: I certify this patient will need inpatient services for at least 2 midnights  Estimated Length of Stay: 3          B Medical/Surgery History History reviewed. No pertinent past medical history. History reviewed. No pertinent surgical history.   A IV Location/Drains/Wounds Patient Lines/Drains/Airways Status     Active Line/Drains/Airways     Name Placement date Placement time Site Days   Peripheral IV 09/02/22 20 G 1" Anterior;Right Forearm 09/02/22  1426  Forearm  less than 1            Intake/Output Last 24 hours No intake or output data in the 24 hours ending 09/02/22 1745  Labs/Imaging Results for  orders placed or performed during the hospital encounter of 09/02/22 (from the past 48 hour(s))  Basic metabolic panel     Status: Abnormal   Collection Time: 09/02/22  2:30 PM  Result Value Ref Range   Sodium 135 135 - 145 mmol/L   Potassium 3.9 3.5 - 5.1 mmol/L   Chloride 104 98 - 111 mmol/L   CO2 23 22 - 32 mmol/L   Glucose, Bld 124 (H) 70 - 99 mg/dL    Comment: Glucose reference range applies only to samples taken after fasting for at least 8 hours.   BUN 16 8 - 23 mg/dL   Creatinine, Ser 7.82 0.61 - 1.24 mg/dL   Calcium 8.5 (L) 8.9 - 10.3 mg/dL   GFR, Estimated >95 >62 mL/min    Comment: (NOTE) Calculated using the CKD-EPI Creatinine Equation (2021)    Anion gap 8 5 - 15    Comment: Performed at [facility], [address] [place], [place] [address]  CBC with Differential     Status: Abnormal   Collection Time: 09/02/22  2:30 PM  Result Value Ref Range   WBC 11.4 (H) 4.0 - 10.5 K/uL   RBC 4.44 4.22 - 5.81 MIL/uL   Hemoglobin 13.3 13.0 - 17.0 g/dL   HCT 57.8 46.9 - 62.9 %   MCV 89.9 80.0 - 100.0 fL   MCH 30.0 26.0 - 34.0 pg   MCHC 33.3 30.0 - 36.0 g/dL   RDW 52.8  11.5 - 15.5 %   Platelets 257 150 - 400 K/uL   nRBC 0.0 0.0 - 0.2 %   Neutrophils Relative % 83 %   Neutro Abs 9.4 (H) 1.7 - 7.7 K/uL   Lymphocytes Relative 5 %   Lymphs Abs 0.6 (L) 0.7 - 4.0 K/uL   Monocytes Relative 11 %   Monocytes Absolute 1.2 (H) 0.1 - 1.0 K/uL   Eosinophils Relative 0 %   Eosinophils Absolute 0.0 0.0 - 0.5 K/uL   Basophils Relative 1 %   Basophils Absolute 0.1 0.0 - 0.1 K/uL   Immature Granulocytes 0 %   Abs Immature Granulocytes 0.05 0.00 - 0.07 K/uL    Comment: Performed at [facility], [address], [place], [place] [address]   DG Hip Unilat With Pelvis 2-3 Views Left  Result Date: 09/02/2022 CLINICAL DATA:  Trauma, fall EXAM: DG HIP (WITH OR WITHOUT PELVIS) 2-3V LEFT COMPARISON:  None Available. FINDINGS: There is comminuted oblique  intertrochanteric fracture and proximal left femur. There is 3 mm space between the fracture lines. There is no dislocation. Degenerative changes are noted in both hips. Degenerative changes are noted in lower lumbar spine. IMPRESSION: Comminuted, slightly displaced recent fracture is seen in the intertrochanteric portion of proximal left femur. Electronically Signed   By: Ernie Avena M.D.   On: 09/02/2022 16:01    Pending Labs Unresulted Labs (From admission, onward)     Start     Ordered   09/03/22 0500  CBC  Tomorrow morning,   R        09/02/22 1724   09/03/22 0500  Basic metabolic panel  Tomorrow morning,   R        09/02/22 1724   09/02/22 1725  Hemoglobin A1c  Once,   URGENT        09/02/22 1724   09/02/22 1721  Type and screen [facility]  Once,   STAT       Comments: [facility]    09/02/22 1724   09/02/22 1719  HIV Antibody (routine testing w rflx)  (HIV Antibody (Routine testing w reflex) panel)  Once,   URGENT        09/02/22 1724            Vitals/Pain Today's Vitals   09/02/22 1415 09/02/22 1416 09/02/22 1417  BP: 123/75    Pulse: 63    Resp: 18    Temp: 98.3 F (36.8 C)    TempSrc: Oral    SpO2: 99%    Weight:   77.1 kg  Height:   6' (1.829 m)  PainSc:  3      Isolation Precautions No active isolations  Medications Medications  HYDROcodone-acetaminophen (NORCO/VICODIN) 5-325 MG per tablet 1-2 tablet (has no administration in time range)  morphine (PF) 2 MG/ML injection 0.5 mg (has no administration in time range)  0.9 % NaCl with KCl 20 mEq/ L  infusion (has no administration in time range)  methocarbamol (ROBAXIN) tablet 500 mg (has no administration in time range)    Or  methocarbamol (ROBAXIN) 500 mg in dextrose 5 % 50 mL IVPB (has no administration in time range)  zolpidem (AMBIEN) tablet 5 mg (has no administration in time range)  docusate sodium (COLACE) capsule 100 mg (has no administration in time  range)  senna (SENOKOT) tablet 8.6 mg (has no administration in time range)  polyethylene glycol (MIRALAX / GLYCOLAX) packet 17 g (has no administration  in time range)  bisacodyl (DULCOLAX) EC tablet 5 mg (has no administration in time range)  magnesium citrate solution 1 Bottle (has no administration in time range)    Mobility non-ambulatory     Focused Assessments Fx to L hip   R Recommendations: See Admitting Provider Note  Report given to:   Additional Notes:  A/Ox4 and independent at baseline

## 2022-09-02 NOTE — ED Triage Notes (Signed)
Pt BIB [facility] from home. Pt fell off skate board last PM and crawled into house. Pt waited to this AM to call EMS. Pt with severe pain to the inner L hip.    EMS Vitals  112/74 HR 94 RR 18 SpO2 95% on R/A CBG 120

## 2022-09-02 NOTE — Progress Notes (Signed)
The risks benefits and alternatives were discussed with the patient including but not limited to the risks of nonoperative treatment, versus surgical intervention including infection, bleeding, nerve injury, malunion, nonunion, the need for revision surgery, hardware prominence, hardware failure, the need for hardware removal, blood clots, cardiopulmonary complications, morbidity, mortality, among others, and they were willing to proceed.    Plan left hip nail.   Eulas Post, MD

## 2022-09-02 NOTE — Anesthesia Preprocedure Evaluation (Addendum)
Anesthesia Evaluation  Patient identified by MRN, date of birth, ID band Patient awake    Reviewed: Allergy & Precautions, NPO status , Patient's Chart, lab work & pertinent test results  History of Anesthesia Complications Negative for: history of anesthetic complications  Airway Mallampati: II  TM Distance: >3 FB Neck ROM: Full    Dental  (+) Dental Advisory Given   Pulmonary Current SmokerPatient did not abstain from smoking.   Pulmonary exam normal        Cardiovascular negative cardio ROS Normal cardiovascular exam     Neuro/Psych negative neurological ROS  negative psych ROS   GI/Hepatic negative GI ROS,,,(+)     substance abuse  alcohol use, cocaine use and marijuana use  Endo/Other  negative endocrine ROS    Renal/GU negative Renal ROS     Musculoskeletal negative musculoskeletal ROS (+)    Abdominal   Peds  Hematology negative hematology ROS (+)   Anesthesia Other Findings   Reproductive/Obstetrics                              Anesthesia Physical Anesthesia Plan  ASA: 3  Anesthesia Plan: General   Post-op Pain Management: Ofirmev IV (intra-op)*, Toradol IV (intra-op)* and Precedex   Induction: Intravenous  PONV Risk Score and Plan: 2 and Treatment may vary due to age or medical condition, Ondansetron, Dexamethasone and Midazolam  Airway Management Planned: Oral ETT  Additional Equipment: None  Intra-op Plan:   Post-operative Plan: Extubation in OR  Informed Consent: I have reviewed the patients History and Physical, chart, labs and discussed the procedure including the risks, benefits and alternatives for the proposed anesthesia with the patient or authorized representative who has indicated his/her understanding and acceptance.     Dental advisory given  Plan Discussed with: CRNA and Anesthesiologist  Anesthesia Plan Comments:          Anesthesia  Quick Evaluation

## 2022-09-02 NOTE — ED Provider Notes (Signed)
Steve Wright EMERGENCY DEPARTMENT AT [facility] Provider Note   CSN: [id number] Arrival date & time: 09/02/22  1407     History Chief Complaint  Patient presents with   Fall    HPI Steve Wright is a 62 y.o. male presenting for chief complaint of fall. States that he was skateboarding yesterday when he lost his balance after hitting a rock fell directly on his left hip.  Was unable to ambulate got assistance to be getting went home and in bed.  Waited 6 hours post unable to ambulate this morning comes in for further care and management by EMS. Significant pain in his left hip otherwise no other injuries.  Patient's recorded medical, surgical, social, medication list and allergies were reviewed in the Snapshot window as part of the initial history.   Review of Systems   Review of Systems  Constitutional:  Negative for chills and fever.  HENT:  Negative for ear pain and sore throat.   Eyes:  Negative for pain and visual disturbance.  Respiratory:  Negative for cough and shortness of breath.   Cardiovascular:  Negative for chest pain and palpitations.  Gastrointestinal:  Negative for abdominal pain and vomiting.  Genitourinary:  Negative for dysuria and hematuria.  Musculoskeletal:  Positive for gait problem. Negative for arthralgias and back pain.  Skin:  Negative for color change and rash.  Neurological:  Negative for seizures and syncope.  All other systems reviewed and are negative.   Physical Exam Updated Vital Signs BP 123/75 (BP Location: Left Arm)   Pulse 63   Temp 98.3 F (36.8 C) (Oral)   Resp 18   Ht 6' (1.829 m)   Wt 77.1 kg   SpO2 99%   BMI 23.06 kg/m  Physical Exam Vitals and nursing note reviewed.  Constitutional:      General: He is not in acute distress.    Appearance: He is well-developed.  HENT:     Head: Normocephalic and atraumatic.  Eyes:     Conjunctiva/sclera: Conjunctivae normal.  Cardiovascular:     Rate and Rhythm: Normal  rate and regular rhythm.     Heart sounds: No murmur heard. Pulmonary:     Effort: Pulmonary effort is normal. No respiratory distress.     Breath sounds: Normal breath sounds.  Abdominal:     Palpations: Abdomen is soft.     Tenderness: There is no abdominal tenderness.  Musculoskeletal:        General: Tenderness and deformity present. No swelling.     Cervical back: Neck supple.  Skin:    General: Skin is warm and dry.     Capillary Refill: Capillary refill takes less than 2 seconds.  Neurological:     Mental Status: He is alert.  Psychiatric:        Mood and Affect: Mood normal.      ED Course/ Medical Decision Making/ A&P    Procedures Procedures   Medications Ordered in ED Medications - No data to display Medical Decision Making:    Steve Wright is a 62 y.o. male who presented to the ED today with a moderate mechanisma trauma, detailed above.    Handoff received from EMS.  Patient placed on continuous vitals and telemetry monitoring while in ED which was reviewed periodically.   Given this mechanism of trauma, a full physical exam was performed. Notably, patient was HDS in NAD.   Reviewed and confirmed nursing documentation for past medical history, family history, social  history.    Initial Assessment/Plan:   This is a patient presenting with a moderate mechanism trauma.  As such, I have considered intracranial injuries including intracranial hemorrhage, intrathoracic injuries including blunt myocardial or blunt lung injury, blunt abdominal injuries including aortic dissection, bladder injury, spleen injury, liver injury and I have considered orthopedic injuries including extremity or spinal injury.  With the patient's presentation of moderate mechanism trauma but an otherwise reassuring exam, patient warrants targeted evaluation for potential traumatic injuries. Will proceed with targeted evaluation for potential injuries. Will proceed with left femur XR.  Objective evaluation resulted with Left femur fracture.   Serial pain control ordered, screening labs grossly reassuring.  Patient has no other medical problems will consult orthopedics for admission and management.  They are coming to evaluate patient at bedside with plan to admit.  Disposition:   Based on the above findings, I believe this patient is stable for admission.    Patient/family educated about specific findings on our evaluation and explained exact reasons for admission.  Patient/family educated about clinical situation and time was allowed to answer questions.   Admission team communicated with and agreed with need for admission. Patient admitted. Patient ready to move at this time.     Emergency Department Medication Summary:   Medications  fentaNYL (SUBLIMAZE) injection 50 mcg (50 mcg Intravenous Given 09/02/22 1433)          Clinical Impression: No diagnosis found.   Data Unavailable   Final Clinical Impression(s) / ED Diagnoses Final diagnoses:  None    Rx / DC Orders ED Discharge Orders     None         Glyn Ade, MD 09/02/22 1627

## 2022-09-03 ENCOUNTER — Encounter (HOSPITAL_COMMUNITY): Payer: Self-pay | Admitting: Orthopedic Surgery

## 2022-09-03 LAB — BASIC METABOLIC PANEL
Anion gap: 5 (ref 5–15)
BUN: 22 mg/dL (ref 8–23)
CO2: 24 mmol/L (ref 22–32)
Calcium: 8.3 mg/dL — ABNORMAL LOW (ref 8.9–10.3)
Chloride: 104 mmol/L (ref 98–111)
Creatinine, Ser: 0.89 mg/dL (ref 0.61–1.24)
GFR, Estimated: >60 mL/min (ref >60)
Glucose, Bld: 162 mg/dL — ABNORMAL HIGH (ref 70–99)
Potassium: 4.2 mmol/L (ref 3.5–5.1)
Sodium: 133 mmol/L — ABNORMAL LOW (ref 135–145)

## 2022-09-03 LAB — CBC
HCT: 36.1 % — ABNORMAL LOW (ref 39.0–52.0)
Hemoglobin: 11.7 g/dL — ABNORMAL LOW (ref 13.0–17.0)
MCH: 29.8 pg (ref 26.0–34.0)
MCHC: 32.4 g/dL (ref 30.0–36.0)
MCV: 91.9 fL (ref 80.0–100.0)
Platelets: 262 10*3/uL (ref 150–400)
RBC: 3.93 MIL/uL — ABNORMAL LOW (ref 4.22–5.81)
RDW: 14.4 % (ref 11.5–15.5)
WBC: 12.7 10*3/uL — ABNORMAL HIGH (ref 4.0–10.5)
nRBC: 0 % (ref 0.0–0.2)

## 2022-09-03 LAB — HEMOGLOBIN A1C
Hgb A1c MFr Bld: 5.4 % (ref 4.8–5.6)
Mean Plasma Glucose: 108.28 mg/dL

## 2022-09-03 LAB — HIV ANTIBODY (ROUTINE TESTING W REFLEX): HIV Screen 4th Generation wRfx: NONREACTIVE

## 2022-09-03 MED ORDER — SENNA-DOCUSATE SODIUM 8.6-50 MG PO TABS: 2.0000 | ORAL_TABLET | Freq: Every day | ORAL | 1 refills | Status: DC

## 2022-09-03 MED ORDER — METHOCARBAMOL 750 MG PO TABS: 750.0000 mg | ORAL_TABLET | Freq: Three times a day (TID) | ORAL | 0 refills | Status: DC | PRN

## 2022-09-03 MED ORDER — OXYCODONE HCL 5 MG PO TABS: 5.0000 mg | ORAL_TABLET | ORAL | 0 refills | Status: DC | PRN

## 2022-09-03 MED ORDER — ONDANSETRON HCL 4 MG PO TABS: 4.0000 mg | ORAL_TABLET | Freq: Three times a day (TID) | ORAL | 0 refills | Status: DC | PRN

## 2022-09-03 MED ORDER — ASPIRIN 325 MG PO TBEC: 325.0000 mg | DELAYED_RELEASE_TABLET | Freq: Two times a day (BID) | ORAL | 0 refills | Status: DC

## 2022-09-03 NOTE — Evaluation (Signed)
Physical Therapy Evaluation Patient Details Name: Steve Wright MRN: [id number] DOB: 1960/11/01 Today's Date: 09/03/2022  History of Present Illness  62 yo male admitted after fall while skateboarding resulting in hip fx. pt underent L hip IM nail on 09/02/22. PMH: no significant hx  Clinical Impression  Pt admitted with above diagnosis.  Pt is very independent at his baseline; able to amb ~ 52' with RW and min assist, unable to WB LLE d/t pain. Anticipate steady progress in acute setting. Pt is pleasant and motivated. He plans to have family and friends/employees checking in on him as needed. Will continue to follow   Pt currently with functional limitations due to the deficits listed below (see PT Problem List). Pt will benefit from acute skilled PT to increase their independence and safety with mobility to allow discharge.          Recommendations for follow up therapy are one component of a multi-disciplinary discharge planning process, led by the attending physician.  Recommendations may be updated based on patient status, additional functional criteria and insurance authorization.  Follow Up Recommendations       Assistance Recommended at Discharge Intermittent Supervision/Assistance  Patient can return home with the following  Assistance with cooking/housework;Assist for transportation;Help with stairs or ramp for entrance    Equipment Recommendations Rolling walker (2 wheels)  Recommendations for Other Services       Functional Status Assessment Patient has had a recent decline in their functional status and demonstrates the ability to make significant improvements in function in a reasonable and predictable amount of time.     Precautions / Restrictions Precautions Precautions: Fall Restrictions Weight Bearing Restrictions: No Other Position/Activity Restrictions: WBAT      Mobility  Bed Mobility Overal bed mobility: Needs Assistance Bed Mobility: Supine to Sit      Supine to sit: Min guard, Min assist     General bed mobility comments: cues for sequence and self assist; incr time,  pt able to use gait belt as leg lifter to progress LLE off bed    Transfers Overall transfer level: Needs assistance Equipment used: Rolling walker (2 wheels) Transfers: Sit to/from Stand Sit to Stand: Min guard           General transfer comment: cues for hand placement and LLE position    Ambulation/Gait Ambulation/Gait assistance: Min guard, Min assist Gait Distance (Feet): 75 Feet Assistive device: Rolling walker (2 wheels) Gait Pattern/deviations: Step-to pattern       General Gait Details: cues for sequence, pt unable to WB LLE d/t pain however able to amb NWB using UEs to off load LLE for pain control. no LOB-min to min/guard for safety  Stairs            Wheelchair Mobility    Modified Rankin (Stroke Patients Only)       Balance   Sitting-balance support: Feet supported, No upper extremity supported Sitting balance-Leahy Scale: Good     Standing balance support: Single extremity supported Standing balance-Leahy Scale: Poor Standing balance comment: reliant on at least  unilateral UE support to steady                             Pertinent Vitals/Pain Pain Assessment Pain Assessment: 0-10 Pain Score: 9  Pain Location: L hip with movement Pain Intervention(s): Limited activity within patient's tolerance, Monitored during session, Premedicated before session, Repositioned    Home Living Family/patient expects to be  discharged to:: Private residence Living Arrangements: Alone Available Help at Discharge: Family Type of Home: House Home Access: Stairs to enter   Secretary/administrator of Steps: 3   Home Layout: One level Home Equipment: None      Prior Function Prior Level of Function : Independent/Modified Independent;Working/employed;Driving             Mobility Comments: 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[place];        Higher education careers adviser        Extremity/Trunk Assessment   Upper Extremity Assessment Upper Extremity Assessment: Overall WFL for tasks assessed    Lower Extremity Assessment Lower Extremity Assessment: LLE deficits/detail LLE Deficits / Details: ankle WFL, knee and hip 2+/5 to 3/5 with ROM and strength ltd by post op pain       Communication   Communication: No difficulties  Cognition Arousal/Alertness: Awake/alert Behavior During Therapy: WFL for tasks assessed/performed Overall Cognitive Status: Within Functional Limits for tasks assessed                                          General Comments      Exercises General Exercises - Lower Extremity Ankle Circles/Pumps: AROM, 10 reps, Both Heel Slides: AAROM, Left, 10 reps   Assessment/Plan    PT Assessment Patient needs continued PT services  PT Problem List Decreased strength;Decreased range of motion;Decreased mobility;Pain;Decreased knowledge of precautions;Decreased balance;Decreased activity tolerance       PT Treatment Interventions DME instruction;Gait training;Functional mobility training;Therapeutic activities;Therapeutic exercise;Stair training;Patient/family education    PT Goals (Current goals can be found in the Care Plan section)  Acute Rehab PT Goals Patient Stated Goal: home soon, get back to work PT Goal Formulation: With patient Time For Goal Achievement: 09/10/22 Potential to Achieve Goals: Good    Frequency Min 1X/week     Co-evaluation               AM-PAC PT "6 Clicks" Mobility  Outcome Measure Help needed turning from your back to your side while in a flat bed without using bedrails?: A Little Help needed moving from lying on your back to sitting on the side of a flat bed without using bedrails?: A Little Help needed moving to and from a bed to a chair (including a wheelchair)?: A Little Help needed standing up from a chair using your arms (e.g., wheelchair or  bedside chair)?: A Little Help needed to walk in hospital room?: A Little Help needed climbing 3-5 steps with a railing? : A Little 6 Click Score: 18    End of Session Equipment Utilized During Treatment: Gait belt Activity Tolerance: Patient tolerated treatment well Patient left: with call bell/phone within reach;in chair;with chair alarm set   PT Visit Diagnosis: Other abnormalities of gait and mobility (R26.89);Difficulty in walking, not elsewhere classified (R26.2)    Time: 1308-6578 PT Time Calculation (min) (ACUTE ONLY): 27 min   Charges:   PT Evaluation $PT Eval Low Complexity: 1 Low PT Treatments $Gait Training: 8-22 mins        Delice Bison, PT  Acute Rehab Dept [facility]) [phone]  09/03/2022   [facility] 09/03/2022, 12:46 PM

## 2022-09-03 NOTE — TOC Transition Note (Signed)
Transition of Care [facility]) - CM/SW Discharge Note   Patient Details  Name: Steve Wright MRN: [id number] Date of Birth: February 03, 1961  Transition of Care [facility]) CM/SW Contact:  Amada Jupiter, LCSW Phone Number: 09/03/2022, 2:11 PM   Clinical Narrative:     Met with pt to review PT recommendations for him at dc:  rolling walker and HHPT.  Pt agreeable and has no agency preferences - referral placed with [employer] to deliver RW to room.  HHPT arranged with [facility].   Also, addressed TOC order to assess for SA concerns.  Pt quickly denies any ETOH or drug abuse and states he does not feel he has a "problem".  He declines any resources needed.  Final next level of care: Home w Home Health Services Barriers to Discharge: No Barriers Identified   Patient Goals and CMS Choice      Discharge Placement                         Discharge Plan and Services Additional resources added to the After Visit Summary for                  DME Arranged: Walker rolling DME Agency: [employer] Date DME Agency Contacted: 09/03/22 Time DME Agency Contacted: 1410 Representative spoke with at DME Agency: Marthann Schiller HH Arranged: PT HH Agency: [facility] Date [facility] Agency Contacted: 09/03/22   Representative spoke with at [facility] Agency: Amy  Social Determinants of Health (SDOH) Interventions SDOH Screenings   Food Insecurity: No Food Insecurity (09/03/2022)  Housing: Low Risk  (09/03/2022)  Transportation Needs: No Transportation Needs (09/03/2022)  Utilities: Not At Risk (09/03/2022)  Tobacco Use: High Risk (09/03/2022)     Readmission Risk Interventions    09/03/2022    1:54 PM  Readmission Risk Prevention Plan  Post Dischage Appt Complete  Medication Screening Complete  Transportation Screening Complete

## 2022-09-03 NOTE — Progress Notes (Addendum)
Subjective: 1 Day Post-Op s/p Procedure(s): INTRAMEDULLARY (IM) NAIL INTERTROCHANTERIC   Patient is alert, oriented.  Patient reports pain as severe with movement, but otherwise doing well at rest.  Denies chest pain, SOB, Calf pain. No nausea/vomiting. No other complaints.  Objective:  PE: VITALS:   Vitals:   09/02/22 2126 09/02/22 2255 09/03/22 0159 09/03/22 0606  BP: (!) 147/81 133/79 121/78 (!) 141/100  Pulse: (!) 53 62 72 77  Resp: 18 18 18 18   Temp: (!) 97.4 F (36.3 C) (!) 97.5 F (36.4 C) 98 F (36.7 C) 98.4 F (36.9 C)  TempSrc:      SpO2: 100% 100% 100% 98%  Weight:      Height:        ABD soft Sensation intact distally Intact pulses distally Dorsiflexion/Plantar flexion intact Incision: dressing C/D/I  LABS  Results for orders placed or performed during the hospital encounter of 09/02/22 (from the past 24 hour(s))  Basic metabolic panel     Status: Abnormal   Collection Time: 09/02/22  2:30 PM  Result Value Ref Range   Sodium 135 135 - 145 mmol/L   Potassium 3.9 3.5 - 5.1 mmol/L   Chloride 104 98 - 111 mmol/L   CO2 23 22 - 32 mmol/L   Glucose, Bld 124 (H) 70 - 99 mg/dL   BUN 16 8 - 23 mg/dL   Creatinine, Ser 1.61 0.61 - 1.24 mg/dL   Calcium 8.5 (L) 8.9 - 10.3 mg/dL   GFR, Estimated >09 >60 mL/min   Anion gap 8 5 - 15  CBC with Differential     Status: Abnormal   Collection Time: 09/02/22  2:30 PM  Result Value Ref Range   WBC 11.4 (H) 4.0 - 10.5 K/uL   RBC 4.44 4.22 - 5.81 MIL/uL   Hemoglobin 13.3 13.0 - 17.0 g/dL   HCT 45.4 09.8 - 11.9 %   MCV 89.9 80.0 - 100.0 fL   MCH 30.0 26.0 - 34.0 pg   MCHC 33.3 30.0 - 36.0 g/dL   RDW 14.7 82.9 - 56.2 %   Platelets 257 150 - 400 K/uL   nRBC 0.0 0.0 - 0.2 %   Neutrophils Relative % 83 %   Neutro Abs 9.4 (H) 1.7 - 7.7 K/uL   Lymphocytes Relative 5 %   Lymphs Abs 0.6 (L) 0.7 - 4.0 K/uL   Monocytes Relative 11 %   Monocytes Absolute 1.2 (H) 0.1 - 1.0 K/uL   Eosinophils Relative 0 %    Eosinophils Absolute 0.0 0.0 - 0.5 K/uL   Basophils Relative 1 %   Basophils Absolute 0.1 0.0 - 0.1 K/uL   Immature Granulocytes 0 %   Abs Immature Granulocytes 0.05 0.00 - 0.07 K/uL  Hemoglobin A1c     Status: None   Collection Time: 09/02/22  2:30 PM  Result Value Ref Range   Hgb A1c MFr Bld 5.4 4.8 - 5.6 %   Mean Plasma Glucose 108.28 mg/dL  Type and screen [facility]     Status: None   Collection Time: 09/02/22  6:40 PM  Result Value Ref Range   ABO/RH(D) B POS    Antibody Screen NEG    Sample Expiration      09/05/2022,2359 Performed at [facility], [address], [place], [place] [address]   ABO/Rh     Status: None   Collection Time: 09/02/22  6:45 PM  Result Value Ref Range   ABO/RH(D)  B POS Performed at [facility], [address] [place], [place] [address]     DG HIP UNILAT WITH PELVIS 2-3 VIEWS LEFT  Result Date: 09/02/2022 CLINICAL DATA:  Status post ORIF of left femoral fracture, initial encounter EXAM: DG HIP (WITH OR WITHOUT PELVIS) 2V LEFT COMPARISON:  None Available. FINDINGS: Pelvic ring is intact. Prior fixation of proximal left femur is noted. Lucency in the intratrochanteric region is again seen. IMPRESSION: Status post ORIF proximal left femoral fracture. Electronically Signed   By: Alcide Clever M.D.   On: 09/02/2022 21:00   DG HIP UNILAT WITH PELVIS 2-3 VIEWS LEFT  Result Date: 09/02/2022 CLINICAL DATA:  Left intratrochanteric femoral fracture EXAM: DG HIP (WITH OR WITHOUT PELVIS) 2-3V LEFT COMPARISON:  None Available. FLUOROSCOPY TIME:  Radiation Exposure Index (as provided by the fluoroscopic device): 4.49 mGy If the device does not provide the exposure index: Fluoroscopy Time:  40 seconds Number of Acquired Images:  6 FINDINGS: Initial images show near complete reduction of the intratrochanteric fracture. Subsequent medullary rod and fixation screws were placed. Fracture fragments are in near  anatomic alignment. IMPRESSION: ORIF of proximal left femoral fracture. Electronically Signed   By: Alcide Clever M.D.   On: 09/02/2022 20:49   DG Hip Port Unilat With Pelvis 1V Left  Result Date: 09/02/2022 CLINICAL DATA:  Status post ORIF of left hip fracture EXAM: DG HIP (WITH OR WITHOUT PELVIS) 1V PORT LEFT COMPARISON:  Film from earlier in the same day. FINDINGS: Proximal medullary rod is noted in the left femur. Proximal and distal fixation screws are seen. Fracture fragments are in near anatomic alignment. IMPRESSION: Status post ORIF of proximal left femoral fracture. Electronically Signed   By: Alcide Clever M.D.   On: 09/02/2022 20:43   DG C-Arm 1-60 Min-No Report  Result Date: 09/02/2022 Fluoroscopy was utilized by the requesting physician.  No radiographic interpretation.   DG Chest Port 1 View  Result Date: 09/02/2022 CLINICAL DATA:  Preop chest radiograph. EXAM: PORTABLE CHEST 1 VIEW COMPARISON:  Chest radiograph dated 11/05/2011. FINDINGS: No focal consolidation, pleural effusion, or pneumothorax. The cardiac silhouette is within normal limits. Degenerative changes spine. No acute osseous pathology. IMPRESSION: No active disease. Electronically Signed   By: Elgie Collard M.D.   On: 09/02/2022 17:44   DG Hip Unilat With Pelvis 2-3 Views Left  Result Date: 09/02/2022 CLINICAL DATA:  Trauma, fall EXAM: DG HIP (WITH OR WITHOUT PELVIS) 2-3V LEFT COMPARISON:  None Available. FINDINGS: There is comminuted oblique intertrochanteric fracture and proximal left femur. There is 3 mm space between the fracture lines. There is no dislocation. Degenerative changes are noted in both hips. Degenerative changes are noted in lower lumbar spine. IMPRESSION: Comminuted, slightly displaced recent fracture is seen in the intertrochanteric portion of proximal left femur. Electronically Signed   By: Ernie Avena M.D.   On: 09/02/2022 16:01    Assessment/Plan: Principal Problem:   Closed  intertrochanteric fracture of hip, left, initial encounter (HCC)  Morning labs pending.  1 Day Post-Op s/p Procedure(s): INTRAMEDULLARY (IM) NAIL INTERTROCHANTERIC  Weightbearing: WBAT LLE Insicional and dressing care: Reinforce dressings as needed VTE prophylaxis: Lovenox 40mg  qd while inpatient, will discharge home on aspirin Pain control: continue current regimen Follow - up plan: 2 weeks with Dr. Dion Saucier Dispo: pending PT eval  Contact information:   Janine Ores, PA-C Weekdays 8-5  After hours and holidays please check [email or web address] for group call information for [facility]  Armida Sans  09/03/2022, 8:36 AM

## 2022-09-03 NOTE — Progress Notes (Signed)
Physical Therapy Treatment Patient Details Name: Steve Wright MRN: [id number] DOB: 04-05-1960 Today's Date: 09/03/2022   History of Present Illness 62 yo male admitted after fall while skateboarding resulting in hip fx. pt underent L hip IM nail on 09/02/22. PMH: no significant hx    PT Comments     Pt is progressing well, reviewed transfer/gait safety and stairs. Pt is unable to WB LLE d/t pain however  is able to off load with UEs to safely mobilize. D/c plan appropriate. Dan Humphreys has been delivered. Pt is hopeful to d/c today; responded to PAs secure chat regarding d/c and notified RN.   Recommendations for follow up therapy are one component of a multi-disciplinary discharge planning process, led by the attending physician.  Recommendations may be updated based on patient status, additional functional criteria and insurance authorization.  Follow Up Recommendations       Assistance Recommended at Discharge Intermittent Supervision/Assistance  Patient can return home with the following Assistance with cooking/housework;Assist for transportation;Help with stairs or ramp for entrance   Equipment Recommendations  Rolling walker (2 wheels)    Recommendations for Other Services       Precautions / Restrictions Precautions Precautions: Fall Restrictions Weight Bearing Restrictions: No Other Position/Activity Restrictions: WBAT     Mobility  Bed Mobility Overal bed mobility: Needs Assistance Bed Mobility: Supine to Sit     Supine to sit: Min guard, Min assist     General bed mobility comments: in recliner    Transfers Overall transfer level: Needs assistance Equipment used: Rolling walker (2 wheels) Transfers: Sit to/from Stand Sit to Stand: Supervision           General transfer comment: cues for hand placement and LLE position    Ambulation/Gait Ambulation/Gait assistance: Supervision, Min guard Gait Distance (Feet): 30 Feet Assistive device: Rolling  walker (2 wheels) Gait Pattern/deviations: Step-to pattern       General Gait Details: cues for sequence and safety, pt unable to WB LLE d/t pain however able to amb NWB using UEs to off load LLE for pain control. supervision for safety   Stairs Stairs: Yes Stairs assistance: Min guard, Min assist Stair Management: Step to pattern, With walker, Backwards Number of Stairs: 2 General stair comments: cues for sequence and technique; assist with walker. no LOB, pt using UB to offload LLE d/t pain   Wheelchair Mobility    Modified Rankin (Stroke Patients Only)       Balance   Sitting-balance support: Feet supported, No upper extremity supported Sitting balance-Leahy Scale: Good     Standing balance support: During functional activity, Reliant on assistive device for balance Standing balance-Leahy Scale: Poor Standing balance comment: reliant on at least  unilateral UE support to steady in static stand, bil UEs support needed for gait                            Cognition Arousal/Alertness: Awake/alert Behavior During Therapy: WFL for tasks assessed/performed Overall Cognitive Status: Within Functional Limits for tasks assessed                                          Exercises General Exercises - Lower Extremity Ankle Circles/Pumps: AROM, 10 reps, Both Heel Slides: AAROM, Left, 10 reps    General Comments        Pertinent Vitals/Pain Pain Assessment  Pain Assessment: 0-10 Pain Score: 7  Pain Location: L hip with movement Pain Descriptors / Indicators: Aching, Grimacing, Guarding Pain Intervention(s): Limited activity within patient's tolerance, Monitored during session, Premedicated before session, Repositioned    Home Living                          Prior Function            PT Goals (current goals can now be found in the care plan section) Acute Rehab PT Goals Patient Stated Goal: home soon, get back to work PT Goal  Formulation: With patient Time For Goal Achievement: 09/10/22 Potential to Achieve Goals: Good Progress towards PT goals: Progressing toward goals    Frequency    Min 1X/week      PT Plan Current plan remains appropriate    Co-evaluation              AM-PAC PT "6 Clicks" Mobility   Outcome Measure  Help needed turning from your back to your side while in a flat bed without using bedrails?: A Little Help needed moving from lying on your back to sitting on the side of a flat bed without using bedrails?: A Little Help needed moving to and from a bed to a chair (including a wheelchair)?: A Little Help needed standing up from a chair using your arms (e.g., wheelchair or bedside chair)?: A Little Help needed to walk in hospital room?: A Little Help needed climbing 3-5 steps with a railing? : A Little 6 Click Score: 18    End of Session Equipment Utilized During Treatment: Gait belt Activity Tolerance: Patient tolerated treatment well Patient left: in chair;with call bell/phone within reach;with chair alarm set   PT Visit Diagnosis: Other abnormalities of gait and mobility (R26.89);Difficulty in walking, not elsewhere classified (R26.2)     Time: 1610-9604 PT Time Calculation (min) (ACUTE ONLY): 24 min  Charges:  $Gait Training: 23-37 mins                     Darbi Chandran, PT  Acute Rehab Dept ([facility]) [phone]  09/03/2022    [facility] 09/03/2022, 4:08 PM

## 2022-09-03 NOTE — Progress Notes (Signed)
Nurse reviewed discharge instructions with pt. Pt verbalized understanding of discharge instructions, follow up appointments and new medications.  No concerns at time of discharge.  Pt friend to take him home.

## 2022-09-03 NOTE — Discharge Instructions (Signed)
Diet: As you were doing prior to hospitalization   Shower/Dressing:  May shower but keep the wounds dry, use an occlusive plastic wrap, NO SOAKING IN TUB.  If the bandage gets wet, change with a clean dry gauze.  We will remove your dressing at 2 weeks follow-up visit.   Activity:  Increase activity slowly as tolerated, but follow the weight bearing instructions below.  The rules on driving is that you can not be taking narcotics while you drive, and you must feel in control of the vehicle.    Weight Bearing:   weight bear as tolerated  To prevent constipation: you may use a stool softener such as -  Colace (over the counter) 100 mg by mouth twice a day  Drink plenty of fluids (prune juice may be helpful) and high fiber foods Miralax (over the counter) for constipation as needed.    Itching:  If you experience itching with your medications, try taking only a single pain pill, or even half a pain pill at a time.  You may take up to 10 pain pills per day, and you can also use benadryl over the counter for itching or also to help with sleep.   Precautions:  If you experience chest pain or shortness of breath - call 911 immediately for transfer to the hospital emergency department!!  If you develop a fever greater that 101 F, purulent drainage from wound, increased redness or drainage from wound, or calf pain -- Call the office at [phone]                                                Follow- Up Appointment:  Please call for an appointment to be seen in 2 weeks [place] - [phone]

## 2022-09-04 NOTE — Anesthesia Postprocedure Evaluation (Signed)
Anesthesia Post Note  Patient: Steve Wright  Procedure(s) Performed: INTRAMEDULLARY (IM) NAIL INTERTROCHANTERIC (Left)     Patient location during evaluation: PACU Anesthesia Type: General Level of consciousness: awake and alert Pain management: pain level controlled Vital Signs Assessment: post-procedure vital signs reviewed and stable Respiratory status: spontaneous breathing, nonlabored ventilation and respiratory function stable Cardiovascular status: stable and blood pressure returned to baseline Anesthetic complications: no  No notable events documented.  Last Vitals:  Vitals:   09/03/22 0938 09/03/22 1410  BP: 132/88 118/78  Pulse: 79 65  Resp: 18 18  Temp: 36.5 C 36.5 C  SpO2: 99% 98%    Last Pain:  Vitals:   09/03/22 1720  TempSrc:   PainSc: 2                  Beryle Lathe

## 2022-09-04 NOTE — Discharge Summary (Signed)
Discharge Summary  Patient ID: Steve Wright MRN: [id number] DOB/AGE: 16-Nov-1960 62 y.o.  Admit date: 09/02/2022 Discharge date: 09/04/2022  Admission Diagnoses:  Closed intertrochanteric fracture of hip, left, initial encounter [facility])  Discharge Diagnoses:  Principal Problem:   Closed intertrochanteric fracture of hip, left, initial encounter [facility])   History reviewed. No pertinent past medical history.  Surgeries: Procedure(s): INTRAMEDULLARY (IM) NAIL INTERTROCHANTERIC on 09/02/2022   Consultants (if any): Treatment Team:  Teryl Lucy, MD  Discharged Condition: Improved  Hospital Course: Steve Wright is an 62 y.o. male who was admitted 09/02/2022 with a diagnosis of Closed intertrochanteric fracture of hip, left, initial encounter [facility]) and went to the operating room on 09/02/2022 and underwent the above named procedures.    He was given perioperative antibiotics:  Anti-infectives (From admission, onward)    Start     Dose/Rate Route Frequency Ordered Stop   09/03/22 0000  ceFAZolin (ANCEF) IVPB 2g/100 mL premix        2 g 200 mL/hr over 30 Minutes Intravenous Every 6 hours 09/02/22 2122 09/03/22 0619   09/02/22 1846  ceFAZolin (ANCEF) 2-4 GM/100ML-% IVPB       Note to Pharmacy: Kathe Becton W: cabinet override      09/02/22 1846 09/02/22 1930   09/02/22 1845  ceFAZolin (ANCEF) IVPB 2g/100 mL premix        2 g 200 mL/hr over 30 Minutes Intravenous On call to O.R. 09/02/22 1829 09/02/22 1937     .  He was given sequential compression devices, early ambulation, and aspirin for DVT prophylaxis.  He benefited maximally from the hospital stay and there were no complications.    Recent vital signs:  Vitals:   09/03/22 0938 09/03/22 1410  BP: 132/88 118/78  Pulse: 79 65  Resp: 18 18  Temp: 97.7 F (36.5 C) 97.7 F (36.5 C)  SpO2: 99% 98%    Recent laboratory studies:  Lab Results  Component Value Date   HGB 11.7 (L) 09/03/2022   HGB 13.3 09/02/2022   HGB  15.0 01/12/2020   Lab Results  Component Value Date   WBC 12.7 (H) 09/03/2022   PLT 262 09/03/2022   No results found for: "INR" Lab Results  Component Value Date   NA 133 (L) 09/03/2022   K 4.2 09/03/2022   CL 104 09/03/2022   CO2 24 09/03/2022   BUN 22 09/03/2022   CREATININE 0.89 09/03/2022   GLUCOSE 162 (H) 09/03/2022    Discharge Medications:   Allergies as of 09/03/2022   No Known Allergies      Medication List     STOP taking these medications    predniSONE 10 MG tablet Commonly known as: DELTASONE   valACYclovir 1000 MG tablet Commonly known as: VALTREX       TAKE these medications    aspirin EC 325 MG tablet Take 1 tablet (325 mg total) by mouth 2 (two) times daily.   EPINEPHrine 0.3 mg/0.3 mL Soaj injection Commonly known as: EPI-PEN Inject 0.3 mg into the muscle as needed for anaphylaxis.   methocarbamol 750 MG tablet Commonly known as: Robaxin-750 Take 1 tablet (750 mg total) by mouth every 8 (eight) hours as needed for muscle spasms.   ondansetron 4 MG tablet Commonly known as: Zofran Take 1 tablet (4 mg total) by mouth every 8 (eight) hours as needed for nausea or vomiting.   oxyCODONE 5 MG immediate release tablet Commonly known as: Roxicodone Take 1 tablet (5 mg total) by  mouth every 4 (four) hours as needed for severe pain.   sennosides-docusate sodium 8.6-50 MG tablet Commonly known as: SENOKOT-S Take 2 tablets by mouth daily.        Diagnostic Studies: DG HIP UNILAT WITH PELVIS 2-3 VIEWS LEFT  Result Date: 09/02/2022 CLINICAL DATA:  Status post ORIF of left femoral fracture, initial encounter EXAM: DG HIP (WITH OR WITHOUT PELVIS) 2V LEFT COMPARISON:  None Available. FINDINGS: Pelvic ring is intact. Prior fixation of proximal left femur is noted. Lucency in the intratrochanteric region is again seen. IMPRESSION: Status post ORIF proximal left femoral fracture. Electronically Signed   By: Alcide Clever M.D.   On: 09/02/2022 21:00    DG HIP UNILAT WITH PELVIS 2-3 VIEWS LEFT  Result Date: 09/02/2022 CLINICAL DATA:  Left intratrochanteric femoral fracture EXAM: DG HIP (WITH OR WITHOUT PELVIS) 2-3V LEFT COMPARISON:  None Available. FLUOROSCOPY TIME:  Radiation Exposure Index (as provided by the fluoroscopic device): 4.49 mGy If the device does not provide the exposure index: Fluoroscopy Time:  40 seconds Number of Acquired Images:  6 FINDINGS: Initial images show near complete reduction of the intratrochanteric fracture. Subsequent medullary rod and fixation screws were placed. Fracture fragments are in near anatomic alignment. IMPRESSION: ORIF of proximal left femoral fracture. Electronically Signed   By: Alcide Clever M.D.   On: 09/02/2022 20:49   DG Hip Port Unilat With Pelvis 1V Left  Result Date: 09/02/2022 CLINICAL DATA:  Status post ORIF of left hip fracture EXAM: DG HIP (WITH OR WITHOUT PELVIS) 1V PORT LEFT COMPARISON:  Film from earlier in the same day. FINDINGS: Proximal medullary rod is noted in the left femur. Proximal and distal fixation screws are seen. Fracture fragments are in near anatomic alignment. IMPRESSION: Status post ORIF of proximal left femoral fracture. Electronically Signed   By: Alcide Clever M.D.   On: 09/02/2022 20:43   DG C-Arm 1-60 Min-No Report  Result Date: 09/02/2022 Fluoroscopy was utilized by the requesting physician.  No radiographic interpretation.   DG Chest Port 1 View  Result Date: 09/02/2022 CLINICAL DATA:  Preop chest radiograph. EXAM: PORTABLE CHEST 1 VIEW COMPARISON:  Chest radiograph dated 11/05/2011. FINDINGS: No focal consolidation, pleural effusion, or pneumothorax. The cardiac silhouette is within normal limits. Degenerative changes spine. No acute osseous pathology. IMPRESSION: No active disease. Electronically Signed   By: Elgie Collard M.D.   On: 09/02/2022 17:44   DG Hip Unilat With Pelvis 2-3 Views Left  Result Date: 09/02/2022 CLINICAL DATA:  Trauma, fall EXAM: DG HIP  (WITH OR WITHOUT PELVIS) 2-3V LEFT COMPARISON:  None Available. FINDINGS: There is comminuted oblique intertrochanteric fracture and proximal left femur. There is 3 mm space between the fracture lines. There is no dislocation. Degenerative changes are noted in both hips. Degenerative changes are noted in lower lumbar spine. IMPRESSION: Comminuted, slightly displaced recent fracture is seen in the intertrochanteric portion of proximal left femur. Electronically Signed   By: Ernie Avena M.D.   On: 09/02/2022 16:01    Disposition: Discharge disposition: 06-Home-Health Care Svc          Follow-up Information     [facility] Follow up.   Why: [facility] to provide home physical therapy visits Contact information: [address] [place] [address] [phone]         Teryl Lucy, MD. Schedule an appointment as soon as possible for a visit in 2 week(s).   Specialty: Orthopedic Surgery Contact information:  [address] [place] [address] [phone]                  Signed: Annita Brod 09/04/2022, 7:19 AM

## 2022-09-12 ENCOUNTER — Emergency Department (HOSPITAL_COMMUNITY)
Admission: EM | Admit: 2022-09-12 | Discharge: 2022-09-13 | Disposition: A | Discharge status: Home / Self Care | Payer: Commercial Managed Care - PPO | Attending: Emergency Medicine | Admitting: Emergency Medicine

## 2022-09-12 ENCOUNTER — Encounter (HOSPITAL_COMMUNITY): Payer: Self-pay | Admitting: Emergency Medicine

## 2022-09-12 ENCOUNTER — Other Ambulatory Visit: Payer: Self-pay

## 2022-09-12 DIAGNOSIS — Z7982 Long term (current) use of aspirin: Secondary | ICD-10-CM | POA: Diagnosis not present

## 2022-09-12 DIAGNOSIS — K5903 Drug induced constipation: Secondary | ICD-10-CM | POA: Diagnosis not present

## 2022-09-12 DIAGNOSIS — K5900 Constipation, unspecified: Secondary | ICD-10-CM | POA: Diagnosis present

## 2022-09-12 DIAGNOSIS — K4090 Unilateral inguinal hernia, without obstruction or gangrene, not specified as recurrent: Secondary | ICD-10-CM | POA: Diagnosis not present

## 2022-09-12 NOTE — ED Triage Notes (Signed)
Pt to ED from home c/o constipation today and right groin hernia with pain.  Pt had hip surgery last Tuesday and taking oxycodone and stool softeners at home.  Last normal BM yesterday but states had pressure today in bottom, unable to have BM, and some leaking.  States had swelling to left groin and into testicles which have since resolved.

## 2022-09-13 NOTE — ED Notes (Signed)
Pt had a large bowel movement prior to discharge, pt expressed relief.

## 2022-09-13 NOTE — Discharge Instructions (Addendum)
For pain control you may take 1000 mg of Tylenol every 8 hours scheduled.  In addition you can take 0.5 to 1 tablet of Oxycodone every 6 hours as needed for pain not controlled with the scheduled Tylenol.  Miralax: Please take 6 capfuls of MiraLAX in a 32 oz bottle of Gatorade over 2-4 hour period on day one. The following day take 3 capfuls on day two. On day three, start taking 1 capful 3 times a day. Slowly cut back as needed until you have normal bowel movements.

## 2022-09-13 NOTE — ED Provider Notes (Signed)
[facility] EMERGENCY DEPARTMENT AT [facility] Provider Note  CSN: [id number] Arrival date & time: 09/12/22 2058  Chief Complaint(s) Constipation and Hernia  HPI Wright Steve is a 62 y.o. male with a past medical history listed below including right hip surgery currently on narcotic medications who presents Wright the emergency department for constipation and left inguinal hernia protruding out due Wright trying Wright have a bowel movement today.  Last bowel movement was yesterday.  Patient reports taking the prescribed Senokot with minimal relief.  He took 2 Dulcolax this evening while trying Wright have a bowel movement, he he noted the left inguinal hernia bulging out.  During the episode, the patient felt lightheaded, cold chills and flushed.  The hernia itself is nontender.  He tried reducing it by applying direct pressure which made it larger and go into his scrotum.  This prompted his visit.  He denies any abdominal pain.  No nausea or vomiting.  No other physical complaints.  The history is provided by the patient.    Past Medical History History reviewed. No pertinent past medical history. Patient Active Problem List   Diagnosis Date Noted   Closed intertrochanteric fracture of hip, left, initial encounter (HCC) 09/02/2022   Home Medication(s) Prior Wright Admission medications   Medication Sig Start Date End Date Taking? Authorizing Provider  aspirin EC 325 MG tablet Take 1 tablet (325 mg total) by mouth 2 (two) times daily. 09/03/22   Armida Sans, PA-C  EPINEPHrine 0.3 mg/0.3 mL IJ SOAJ injection Inject 0.3 mg into the muscle as needed for anaphylaxis. 01/12/20   Geoffery Lyons, MD  methocarbamol (ROBAXIN-750) 750 MG tablet Take 1 tablet (750 mg total) by mouth every 8 (eight) hours as needed for muscle spasms. 09/03/22   Janine Ores K, PA-C  ondansetron (ZOFRAN) 4 MG tablet Take 1 tablet (4 mg total) by mouth every 8 (eight) hours as needed for nausea or vomiting. 09/03/22   Armida Sans, PA-C  oxyCODONE (ROXICODONE) 5 MG immediate release tablet Take 1 tablet (5 mg total) by mouth every 4 (four) hours as needed for severe pain. 09/03/22   Janine Ores K, PA-C  sennosides-docusate sodium (SENOKOT-S) 8.6-50 MG tablet Take 2 tablets by mouth daily. 09/03/22   Armida Sans, PA-C                                                                                                                                    Allergies Patient has no known allergies.  Review of Systems Review of Systems As noted in HPI  Physical Exam Vital Signs  I have reviewed the triage vital signs BP (!) 171/111 (BP Location: Right Arm)   Pulse 68   Temp 97.8 F (36.6 C) (Oral)   Resp 18   Ht 6' (1.829 m)   Wt 77.1 kg   SpO2 100%   BMI 23.06 kg/m   Physical  Exam Vitals reviewed.  Constitutional:      General: He is not in acute distress.    Appearance: He is well-developed. He is not diaphoretic.  HENT:     Head: Normocephalic and atraumatic.     Right Ear: External ear normal.     Left Ear: External ear normal.     Nose: Nose normal.     Mouth/Throat:     Mouth: Mucous membranes are moist.  Eyes:     General: No scleral icterus.    Conjunctiva/sclera: Conjunctivae normal.  Neck:     Trachea: Phonation normal.  Cardiovascular:     Rate and Rhythm: Normal rate and regular rhythm.  Pulmonary:     Effort: Pulmonary effort is normal. No respiratory distress.     Breath sounds: No stridor.  Abdominal:     General: There is no distension.     Tenderness: There is no abdominal tenderness.     Hernia: A hernia is present. Hernia is present in the left inguinal area (nontender and easily reducible).  Musculoskeletal:        General: Normal range of motion.     Cervical back: Normal range of motion.  Neurological:     Mental Status: He is alert and oriented Wright person, place, and time.  Psychiatric:        Behavior: Behavior normal.     ED Results and Treatments Labs (all  labs ordered are listed, but only abnormal results are displayed) Labs Reviewed - No data Wright display                                                                                                                       EKG  EKG Interpretation Date/Time:    Ventricular Rate:    PR Interval:    QRS Duration:    QT Interval:    QTC Calculation:   R Axis:      Text Interpretation:         Radiology No results found.  Medications Ordered in ED Medications - No data Wright display Procedures Procedures  (including critical care time) Medical Decision Making / ED Course   Medical Decision Making   Patient presents for left inguinal hernia.  It is nontender and easily reducible.  He is suffering from constipation due Wright narcotic use for recent left hip surgery.  Abdomen is benign and not concerning for bowel obstruction.  Doubt incarcerated hernia.  Patient currently feels asymptomatic.   Low suspicion for serious intra-abdominal inflammatory/infectious process requiring labs or imaging at this time.  Discussed additional pain regiment Wright reduce narcotic use.  Also discussed bowel regiment.  Patient feels comfortable with the plan at this time.    Final Clinical Impression(s) / ED Diagnoses Final diagnoses:  Drug-induced constipation  Left inguinal hernia   The patient appears reasonably screened and/or stabilized for discharge and I doubt any other medical condition or other [facility] requiring further screening, evaluation, or treatment in the ED at  this time. I have discussed the findings, Dx and Tx plan with the patient/family who expressed understanding and agree(s) with the plan. Discharge instructions discussed at length. The patient/family was given strict return precautions who verbalized understanding of the instructions. No further questions at time of discharge.  Disposition: Discharge  Condition: Good  ED Discharge Orders     None        Follow Up: Primary care  provider  Call    [facility] [address] [place] [address] Call  as needed Wright discuss hernia    This chart was dictated using voice recognition software.  Despite best efforts Wright proofread,  errors can occur which can change the documentation meaning.    Nira Conn, MD 09/13/22 [phone]

## 2023-08-12 ENCOUNTER — Emergency Department (HOSPITAL_COMMUNITY)

## 2023-08-12 ENCOUNTER — Other Ambulatory Visit: Payer: Self-pay

## 2023-08-12 ENCOUNTER — Emergency Department (HOSPITAL_COMMUNITY)
Admission: EM | Admit: 2023-08-12 | Discharge: 2023-08-13 | Disposition: A | Discharge status: Home / Self Care | Attending: Emergency Medicine | Admitting: Emergency Medicine

## 2023-08-12 ENCOUNTER — Encounter (HOSPITAL_COMMUNITY): Payer: Self-pay

## 2023-08-12 DIAGNOSIS — Z7982 Long term (current) use of aspirin: Secondary | ICD-10-CM | POA: Insufficient documentation

## 2023-08-12 DIAGNOSIS — Y9389 Activity, other specified: Secondary | ICD-10-CM | POA: Insufficient documentation

## 2023-08-12 DIAGNOSIS — M25552 Pain in left hip: Secondary | ICD-10-CM

## 2023-08-12 DIAGNOSIS — M7062 Trochanteric bursitis, left hip: Secondary | ICD-10-CM | POA: Diagnosis not present

## 2023-08-12 DIAGNOSIS — R1030 Lower abdominal pain, unspecified: Secondary | ICD-10-CM | POA: Diagnosis present

## 2023-08-12 DIAGNOSIS — K4090 Unilateral inguinal hernia, without obstruction or gangrene, not specified as recurrent: Secondary | ICD-10-CM | POA: Insufficient documentation

## 2023-08-12 LAB — CBC
HCT: 42.8 % (ref 39.0–52.0)
Hemoglobin: 13.7 g/dL (ref 13.0–17.0)
MCH: 29.5 pg (ref 26.0–34.0)
MCHC: 32 g/dL (ref 30.0–36.0)
MCV: 92.2 fL (ref 80.0–100.0)
Platelets: 436 10*3/uL — ABNORMAL HIGH (ref 150–400)
RBC: 4.64 MIL/uL (ref 4.22–5.81)
RDW: 13.6 % (ref 11.5–15.5)
WBC: 12.4 10*3/uL — ABNORMAL HIGH (ref 4.0–10.5)
nRBC: 0 % (ref 0.0–0.2)

## 2023-08-12 LAB — COMPREHENSIVE METABOLIC PANEL WITH GFR
ALT: 12 U/L (ref 0–44)
AST: 17 U/L (ref 15–41)
Albumin: 3.3 g/dL — ABNORMAL LOW (ref 3.5–5.0)
Alkaline Phosphatase: 83 U/L (ref 38–126)
Anion gap: 9 (ref 5–15)
BUN: 14 mg/dL (ref 8–23)
CO2: 27 mmol/L (ref 22–32)
Calcium: 8.8 mg/dL — ABNORMAL LOW (ref 8.9–10.3)
Chloride: 97 mmol/L — ABNORMAL LOW (ref 98–111)
Creatinine, Ser: 0.66 mg/dL (ref 0.61–1.24)
GFR, Estimated: >60 mL/min (ref >60)
Glucose, Bld: 107 mg/dL — ABNORMAL HIGH (ref 70–99)
Potassium: 3.8 mmol/L (ref 3.5–5.1)
Sodium: 133 mmol/L — ABNORMAL LOW (ref 135–145)
Total Bilirubin: 0.4 mg/dL (ref 0.0–1.2)
Total Protein: 7.5 g/dL (ref 6.5–8.1)

## 2023-08-12 LAB — URINALYSIS, W/ REFLEX TO CULTURE (INFECTION SUSPECTED)
Bacteria, UA: NONE SEEN
Bilirubin Urine: NEGATIVE
Glucose, UA: 150 mg/dL — AB
Hgb urine dipstick: NEGATIVE
Ketones, ur: NEGATIVE mg/dL
Leukocytes,Ua: NEGATIVE
Nitrite: NEGATIVE
Protein, ur: NEGATIVE mg/dL
Specific Gravity, Urine: 1.031 — ABNORMAL HIGH (ref 1.005–1.030)
pH: 5 (ref 5.0–8.0)

## 2023-08-12 LAB — LACTIC ACID, PLASMA: Lactic Acid, Venous: 1.5 mmol/L (ref 0.5–1.9)

## 2023-08-12 LAB — LIPASE, BLOOD: Lipase: 24 U/L (ref 11–51)

## 2023-08-12 MED ORDER — CYCLOBENZAPRINE HCL 10 MG PO TABS: 10.0000 mg | ORAL_TABLET | Freq: Two times a day (BID) | ORAL | 0 refills | Status: AC | PRN

## 2023-08-12 MED ORDER — SODIUM CHLORIDE 0.9 % IV BOLUS
1000.0000 mL | Freq: Once | INTRAVENOUS | Status: AC
  Administered 2023-08-12: 1000 mL via INTRAVENOUS

## 2023-08-12 MED ORDER — ONDANSETRON HCL 4 MG/2ML IJ SOLN
4.0000 mg | Freq: Once | INTRAMUSCULAR | Status: AC
  Administered 2023-08-12: 4 mg via INTRAVENOUS
  Filled 2023-08-12: qty 2

## 2023-08-12 MED ORDER — OXYCODONE-ACETAMINOPHEN 5-325 MG PO TABS: 1.0000 | ORAL_TABLET | Freq: Four times a day (QID) | ORAL | 0 refills | Status: DC | PRN

## 2023-08-12 MED ORDER — IOHEXOL 300 MG/ML  SOLN
100.0000 mL | Freq: Once | INTRAMUSCULAR | Status: AC | PRN
Start: 2023-08-12 — End: 2023-08-12
  Administered 2023-08-12: 100 mL via INTRAVENOUS

## 2023-08-12 MED ORDER — HYDROMORPHONE HCL 1 MG/ML IJ SOLN
1.0000 mg | Freq: Once | INTRAMUSCULAR | Status: AC
  Administered 2023-08-12: 1 mg via INTRAVENOUS
  Filled 2023-08-12: qty 1

## 2023-08-12 NOTE — ED Triage Notes (Addendum)
 Pt came in for post op pain and a possible skin infection. Pt stated he has a mild hernia on the left scrotum and pain around his surgical site on his left femur for about two days.

## 2023-08-12 NOTE — Discharge Instructions (Signed)
 Your history, exam, workup today seem consistent with inguinal hernia that we were able to reduce causing your groin pain and evidence of left trochanteric femur bursitis likely from the injury last week to it.  Your labs were overall similar to prior and you did not have a fever.  We have low suspicion for infection at this time and we do feel you are appropriate for outpatient follow-up with general surgery for the hernia and orthopedics for the bursitis as you had surgery in that area around a year ago.  Please rest and stay hydrated and use the pain and muscle medicine till symptoms.  If any symptoms change or worsen acutely, please return to the nearest emergency department.

## 2023-08-12 NOTE — ED Provider Notes (Signed)
 [place] EMERGENCY DEPARTMENT AT [facility] Provider Note   CSN: [id number] Arrival date & time: 08/12/23  1527     History  Chief Complaint  Patient presents with   Post-op Problem    Steve Wright is a 63 y.o. male.  The history is provided by the patient and medical records. No language interpreter was used.  Groin Pain This is a recurrent problem. The current episode started more than 2 days ago. The problem occurs constantly. The problem has not changed since onset.Associated symptoms include abdominal pain (inguinal pain). Pertinent negatives include no chest pain, no headaches and no shortness of breath. Nothing aggravates the symptoms. Nothing relieves the symptoms. He has tried nothing for the symptoms. The treatment provided no relief.       Home Medications Prior to Admission medications   Medication Sig Start Date End Date Taking? Authorizing Provider  aspirin  EC 325 MG tablet Take 1 tablet (325 mg total) by mouth 2 (two) times daily. 09/03/22   Brown, Blaine K, PA-C  EPINEPHrine  0.3 mg/0.3 mL IJ SOAJ injection Inject 0.3 mg into the muscle as needed for anaphylaxis. 01/12/20   Orvilla Blander, MD  methocarbamol  (ROBAXIN -750) 750 MG tablet Take 1 tablet (750 mg total) by mouth every 8 (eight) hours as needed for muscle spasms. 09/03/22   Brown, Blaine K, PA-C  ondansetron  (ZOFRAN ) 4 MG tablet Take 1 tablet (4 mg total) by mouth every 8 (eight) hours as needed for nausea or vomiting. 09/03/22   Brown, Blaine K, PA-C  oxyCODONE  (ROXICODONE ) 5 MG immediate release tablet Take 1 tablet (5 mg total) by mouth every 4 (four) hours as needed for severe pain. 09/03/22   Brown, Blaine K, PA-C  sennosides-docusate sodium  (SENOKOT-S) 8.6-50 MG tablet Take 2 tablets by mouth daily. 09/03/22   Brown, Blaine K, PA-C      Allergies    Patient has no known allergies.    Review of Systems   Review of Systems  Constitutional:  Positive for appetite change, chills and  fatigue. Negative for fever.  HENT:  Negative for congestion.   Respiratory:  Negative for cough, chest tightness, shortness of breath and wheezing.   Cardiovascular:  Negative for chest pain, palpitations and leg swelling.  Gastrointestinal:  Positive for abdominal pain (inguinal pain) and nausea. Negative for constipation, diarrhea and vomiting.  Genitourinary:  Positive for genital sores, scrotal swelling and testicular pain. Negative for decreased urine volume, dysuria and penile pain.  Musculoskeletal:  Negative for back pain, neck pain and neck stiffness.  Skin:  Negative for rash and wound.  Neurological:  Negative for headaches.  Psychiatric/Behavioral:  Negative for agitation and confusion.   All other systems reviewed and are negative.   Physical Exam Updated Vital Signs BP 109/75   Pulse 98   Temp 98 F (36.7 C) (Oral)   Resp 16   SpO2 97%  Physical Exam Vitals and nursing note reviewed. Exam conducted with a chaperone present.  Constitutional:      General: He is not in acute distress.    Appearance: He is well-developed. He is not ill-appearing, toxic-appearing or diaphoretic.  HENT:     Head: Normocephalic and atraumatic.     Nose: No congestion or rhinorrhea.     Mouth/Throat:     Mouth: Mucous membranes are dry.     Pharynx: No oropharyngeal exudate or posterior oropharyngeal erythema.  Eyes:     Extraocular Movements: Extraocular movements intact.     Conjunctiva/sclera:  Conjunctivae normal.     Pupils: Pupils are equal, round, and reactive to light.  Cardiovascular:     Rate and Rhythm: Normal rate and regular rhythm.     Heart sounds: No murmur heard. Pulmonary:     Effort: Pulmonary effort is normal. No respiratory distress.     Breath sounds: Normal breath sounds. No wheezing, rhonchi or rales.  Chest:     Chest wall: No tenderness.  Abdominal:     General: Abdomen is flat.     Palpations: Abdomen is soft.     Tenderness: There is abdominal  tenderness. There is no right CVA tenderness, left CVA tenderness, guarding or rebound.     Hernia: A hernia is present. Hernia is present in the left inguinal area.  Genitourinary:    Penis: No erythema.      Testes:        Left: Tenderness present.     Epididymis:     Left: Tenderness present.       Comments: Tenderness in L thigh.    Musculoskeletal:        General: Tenderness present. No swelling.     Cervical back: Neck supple.  Skin:    General: Skin is warm and dry.     Capillary Refill: Capillary refill takes less than 2 seconds.  Neurological:     Mental Status: He is alert.  Psychiatric:        Mood and Affect: Mood normal.     ED Results / Procedures / Treatments   Labs (all labs ordered are listed, but only abnormal results are displayed) Labs Reviewed  COMPREHENSIVE METABOLIC PANEL WITH GFR - Abnormal; Notable for the following components:      Result Value   Sodium 133 (*)    Chloride 97 (*)    Glucose, Bld 107 (*)    Calcium 8.8 (*)    Albumin 3.3 (*)    All other components within normal limits  CBC - Abnormal; Notable for the following components:   WBC 12.4 (*)    Platelets 436 (*)    All other components within normal limits  URINALYSIS, W/ REFLEX TO CULTURE (INFECTION SUSPECTED) - Abnormal; Notable for the following components:   Specific Gravity, Urine 1.031 (*)    Glucose, UA 150 (*)    All other components within normal limits  LACTIC ACID, PLASMA  LIPASE, BLOOD  LACTIC ACID, PLASMA    EKG None  Radiology CT ABDOMEN PELVIS W CONTRAST Result Date: 08/12/2023 CLINICAL DATA:  Abdominal pain, acute, nonlocalized LLQ abdominal pain L groin hernia and pain to scrotum. please include scrotum EXAM: CT ABDOMEN AND PELVIS WITH CONTRAST TECHNIQUE: Multidetector CT imaging of the abdomen and pelvis was performed using the standard protocol following bolus administration of intravenous contrast. RADIATION DOSE REDUCTION: This exam was performed  according to the departmental dose-optimization program which includes automated exposure control, adjustment of the mA and/or kV according to patient size and/or use of iterative reconstruction technique. CONTRAST:  OMNIPAQUE IOHEXOL 300 MG/ML  SOLN COMPARISON:  Scrotal ultrasound 08/12/2023 FINDINGS: Lower chest: No focal airspace consolidation or pleural effusion.Posterior bibasilar dependent atelectasis. Hepatobiliary: No mass.Decompressed gallbladder without radiopaque stones or wall thickening. No intrahepatic or extrahepatic biliary ductal dilation. The portal veins are patent. Pancreas: No mass or main ductal dilation. No peripancreatic inflammation or fluid collection. Spleen: Normal size. No mass. Adrenals/Urinary Tract: No adrenal masses. No renal mass. No nephrolithiasis or hydronephrosis. The urinary bladder is distended without focal abnormality.  Stomach/Bowel: The stomach is decompressed without focal abnormality. No small bowel wall thickening or inflammation. No small bowel obstruction.Normal appendix. Vascular/Lymphatic: No aortic aneurysm. Scattered aortoiliac atherosclerosis. No intraabdominal or pelvic lymphadenopathy. Reproductive: No prostatomegaly.No free pelvic fluid. Other: No pneumoperitoneum, ascites, or mesenteric inflammation. Musculoskeletal: Hypodense fluid along the left greater trochanter, measuring 1.3 x 2.8 cm (axial 72). No acute fracture or destructive lesion. Moderate dextrocurvature of the thoracolumbar spine. Multilevel degenerative disc disease of the spine. Left femoral intramedullary nail, partially visualized. Redemonstrated hydroceles. Small amount of prominent fat within the left spermatic cord. IMPRESSION: 1. Hypodense fluid along the left greater trochanter, measuring 1.3 x 2.8 cm (axial 72), likely reflecting changes of trochanteric bursitis. Laboratory correlation recommended to evaluate for possible infection. 2. Small amount of prominent fat within the left  spermatic cord, which may represent a small inguinal hernia or changes of a inguinal canal lipoma. Correlation with physical exam findings recommended. 3. Redemonstrated bilateral hydroceles, better visualized on the comparison ultrasound. Electronically Signed   By: Rance Burrows M.D.   On: 08/12/2023 19:47   US  SCROTUM W/DOPPLER Result Date: 08/12/2023 CLINICAL DATA:  Left-sided hernia, pain EXAM: SCROTAL ULTRASOUND DOPPLER ULTRASOUND OF THE TESTICLES TECHNIQUE: Complete ultrasound examination of the testicles, epididymis, and other scrotal structures was performed. Color and spectral Doppler ultrasound were also utilized to evaluate blood flow to the testicles. COMPARISON:  None Available. FINDINGS: Right testicle Measurements: 5.1 x 3.2 x 3 cm. No mass or microlithiasis visualized. Single, small linear echogenicity, likely calcification. Striated heterogeneity of the testicular parenchyma, which may represent age-related seminiferous tubular atrophy. Left testicle Measurements: 4.1 x 2.3 x 2.8 cm. No mass or microlithiasis visualized. Striated heterogeneity of the testicular parenchyma, which may represent age-related seminiferous tubular atrophy. Right epididymis:  Normal in size and appearance. Left epididymis:  Epididymal cyst measuring 7 x 7 mm. Hydrocele:  Moderate volume right hydrocele.  Small left hydrocele. Varicocele:  None visualized. Pulsed Doppler interrogation of both testes demonstrates normal low resistance arterial and venous waveforms bilaterally. IMPRESSION: 1. No acute sonographic abnormality within the testicles; more specifically, no testicular mass, or findings of epididymo-orchitis, or changes of testicular torsion, at this time. 2. Moderate volume right and small left hydroceles. Electronically Signed   By: Rance Burrows M.D.   On: 08/12/2023 18:20   DG Femur Min 2 Views Left Result Date: 08/12/2023 CLINICAL DATA:  Left hip pain.  Previous fracture ORIF. EXAM: LEFT FEMUR 2  VIEWS COMPARISON:  09/02/2022. FINDINGS: Short intramedullary rod supports a compression screw that extends across the left femoral neck into the femoral head. Orthopedic hardware is well seated and positioned. Previously noted intertrochanteric fracture has healed. No acute fracture. No bone lesion. Knee and hip joints are normally aligned. IMPRESSION: 1. No fracture or dislocation.  No acute finding. 2. Well-positioned left proximal femur ORIF hardware. Electronically Signed   By: Amanda Jungling M.D.   On: 08/12/2023 17:48    Procedures Procedures    Medications Ordered in ED Medications  HYDROmorphone  (DILAUDID ) injection 1 mg (has no administration in time range)  HYDROmorphone  (DILAUDID ) injection 1 mg (1 mg Intravenous Given 08/12/23 1636)  ondansetron  (ZOFRAN ) injection 4 mg (4 mg Intravenous Given 08/12/23 1636)  sodium chloride  0.9 % bolus 1,000 mL (0 mLs Intravenous Stopped 08/12/23 2106)  iohexol (OMNIPAQUE) 300 MG/ML solution 100 mL (100 mLs Intravenous Contrast Given 08/12/23 1815)    ED Course/ Medical Decision Making/ A&P  Medical Decision Making Amount and/or Complexity of Data Reviewed Labs: ordered. Radiology: ordered.  Risk Prescription drug management.    Steve Wright is a 63 y.o. male with a past medical history significant for previous left hip surgery and intermittent left inguinal hernia who presents with left thigh, groin, and abdominal pain.  According to patient, he was told last year that he needed surgery for his hernia but decided he ended up not wanting surgery.  He normally was able to reduce it without difficulties however over the last 2 or 3 days the pain has been extremely severe.  He reports he cannot push the bulge back in and the pain is unbearable.  He reports the pain and swelling extends into his left scrotum and left testicle area and into his left thigh.  He is worried about the hardware in his left femur as it  radiates there.  He denies any new fall or injury.  Denies any urinary changes.  Reports some constipation and decreased bowel movements and decreased flatus at times.  Reports some nausea but no vomiting.  Reports feeling fatigued.  Denies chest pain shortness breath or cough.  Denies new leg pain or leg swelling otherwise.  On exam with a chaperone, patient has a palpable swelling tenderness to his left inguinal area that appears like a hernia.  Patient would not let me try to reduce it with force due to pain.  He also had tenderness in the left scrotum and left testicle and epididymis slightly.  Penis otherwise nontender.  Left lower abdomen slightly tender.  No rash seen.  No wounds seen.  Patient reported pain in his left thigh I had slight tenderness on the left mid thigh but no skin changes seen.  Distally had intact sensation strength and pulses.  Hip otherwise nontender itself.  Lungs clear.  Chest nontender.  Bowel sounds were appreciated.  Clinically suspect hernia pain with possible incarceration.  We will use an ice pack, give him pain medicine and nausea medicine, he will remain n.p.o., and we will try to reduce the hernia.  Will order CT abdomen pelvis with contrast as well as testicular ultrasound and x-ray of the thigh.  Will get labs and urinalysis.  Anticipate reassessment after reduction attempt and imaging and workup.  4:43 PM On reassessment, the ice pain medicine and pressure has reduced to the large tennis ball sized bulge in his left groin.  He reports the pain has improved.  I feel his hernia did reduce. Will get this ultrasound, x-ray, CT, and other labs.     Patient CT scan did not show evidence of further incarceration or bowel obstruction.  Shows evidence of the recent hernia.  The scrotal ultrasound not show torsion, just hydroceles field the CT scan and x-ray did not show problem of the hardware but did show a likely traumatic bursitis of the left trochanter.  Patient did  now say that he bumped it about a week ago when his symptoms began so I think this traumatic bursitis.  We discussed that given his white count being similar what has been in the past, normal lactic acid, and this trauma, have low suspicion for a septic bursitis that would need surgery or orthopedic evaluation tonight.  Patient agrees.  Will give prescription for pain medicine and he will follow-up with his orthopedic team for the bursitis, will give him general surgery follow-up for this recurrent hernia, and he will be discharged home for outpatient follow-up.  Patient agrees  with this plan of care and was discharged in stable condition with reassuring vital signs.         Final Clinical Impression(s) / ED Diagnoses Final diagnoses:  Trochanteric bursitis of left hip  Left hip pain  Left inguinal hernia    Rx / DC Orders ED Discharge Orders          Ordered    oxyCODONE -acetaminophen  (PERCOCET/ROXICET) 5-325 MG tablet  Every 6 hours PRN        08/12/23 2300    cyclobenzaprine (FLEXERIL) 10 MG tablet  2 times daily PRN        08/12/23 2300            Clinical Impression: 1. Trochanteric bursitis of left hip   2. Left hip pain   3. Left inguinal hernia     Disposition: Discharge  Condition: Good  I have discussed the results, Dx and Tx plan with the pt(& family if present). He/she/they expressed understanding and agree(s) with the plan. Discharge instructions discussed at great length. Strict return precautions discussed and pt &/or family have verbalized understanding of the instructions. No further questions at time of discharge.    New Prescriptions   CYCLOBENZAPRINE (FLEXERIL) 10 MG TABLET    Take 1 tablet (10 mg total) by mouth 2 (two) times daily as needed for muscle spasms.   OXYCODONE -ACETAMINOPHEN  (PERCOCET/ROXICET) 5-325 MG TABLET    Take 1 tablet by mouth every 6 (six) hours as needed for severe pain (pain score 7-10).    Follow Up: Surgery, [facility] [address] [place] [address] [phone]     Osa Blase, MD [address] [place] [address] [phone]         Tanica Gaige, Marine Sia, MD 08/12/23 [phone]

## 2023-08-18 ENCOUNTER — Other Ambulatory Visit: Payer: Self-pay

## 2023-08-18 ENCOUNTER — Emergency Department (HOSPITAL_COMMUNITY)
Admission: EM | Admit: 2023-08-18 | Discharge: 2023-08-18 | Discharge status: Against Medical Advice | Attending: Emergency Medicine | Admitting: Emergency Medicine

## 2023-08-18 ENCOUNTER — Encounter (HOSPITAL_COMMUNITY): Payer: Self-pay

## 2023-08-18 DIAGNOSIS — Z5321 Procedure and treatment not carried out due to patient leaving prior to being seen by health care provider: Secondary | ICD-10-CM | POA: Insufficient documentation

## 2023-08-18 DIAGNOSIS — K4090 Unilateral inguinal hernia, without obstruction or gangrene, not specified as recurrent: Secondary | ICD-10-CM | POA: Insufficient documentation

## 2023-08-18 NOTE — ED Triage Notes (Signed)
 Pt arrives via POV. Pt reports ongoing pain from a known inguinal hernia. Pt seen on 08/12/23 for the same. Pt denies any other symptoms.

## 2023-08-18 NOTE — ED Notes (Signed)
 Pt decided not to wait any longer. RN notified

## 2023-08-20 ENCOUNTER — Ambulatory Visit: Payer: Self-pay | Admitting: Surgery

## 2023-08-20 NOTE — Patient Instructions (Addendum)
 SURGICAL WAITING ROOM VISITATION  Patients having surgery or a procedure may have no more than 2 support people in the waiting area - these visitors may rotate.    Children under the age of 69 must have an adult with them who is not the patient.  Visitors with respiratory illnesses are discouraged from visiting and should remain at home.  If the patient needs to stay at the hospital during part of their recovery, the visitor guidelines for inpatient rooms apply. Pre-op nurse will coordinate an appropriate time for 1 support person to accompany patient in pre-op.  This support person may not rotate.    Please refer to the [facility] website for the visitor guidelines for Inpatients (after your surgery is over and you are in a regular room).       Your procedure is scheduled on: 08-25-23   Report to [facility] Main Entrance    Report to admitting at      0515 AM   Call this number if you have problems the morning of surgery [phone]   Do not eat food  OR DRINK LIQUIDS AFTER MIDNIGHT:After Midnight.   If you have questions, please contact your surgeon's office.  FOLLOW ANY ADDITIONAL PRE OP INSTRUCTIONS YOU RECEIVED FROM YOUR SURGEON'S OFFICE!!!     Oral Hygiene is also important to reduce your risk of infection.                                    Remember - BRUSH YOUR TEETH THE MORNING OF SURGERY WITH YOUR REGULAR TOOTHPASTE  DENTURES WILL BE REMOVED PRIOR TO SURGERY PLEASE DO NOT APPLY "Poly grip" OR ADHESIVES!!!   Do NOT smoke after Midnight   Stop all vitamins and herbal supplements 7 days before surgery.   Take these medicines the morning of surgery with A SIP OF WATER: oxycodone  if needed,                               You may not have any metal on your body including  jewelry, and body piercing             Do not wear  lotions, powders, cologne, or deodorant               Men may shave face and neck.   Do not bring valuables to the hospital. [facility] IS NOT             RESPONSIBLE   FOR VALUABLES.   Contacts, glasses, dentures or bridgework may not be worn into surgery.    DO NOT BRING YOUR HOME MEDICATIONS TO THE HOSPITAL. PHARMACY WILL DISPENSE MEDICATIONS LISTED ON YOUR MEDICATION LIST TO YOU DURING YOUR ADMISSION IN THE HOSPITAL!    Patients discharged on the day of surgery will not be allowed to drive home.  Someone NEEDS to stay with you for the first 24 hours after anesthesia.   Special Instructions: Bring a copy of your healthcare power of attorney and living will documents the day of surgery if you haven't scanned them before.              Please read over the following fact sheets you were given: IF YOU HAVE QUESTIONS ABOUT YOUR PRE-OP INSTRUCTIONS PLEASE CALL [phone]     [facility] - Preparing for Surgery Before surgery, you can  play an important role.  Because skin is not sterile, your skin needs to be as free of germs as possible.  You can reduce the number of germs on your skin by washing with CHG (chlorahexidine gluconate) soap before surgery.  CHG is an antiseptic cleaner which kills germs and bonds with the skin to continue killing germs even after washing. Please DO NOT use if you have an allergy to CHG or antibacterial soaps.  If your skin becomes reddened/irritated stop using the CHG and inform your nurse when you arrive at Short Stay. Do not shave (including legs and underarms) for at least 48 hours prior to the first CHG shower.  You may shave your face/neck. Please follow these instructions carefully:  1.  Shower with CHG Soap the night before surgery and the  morning of Surgery.  2.  If you choose to wash your hair, wash your hair first as usual with your  normal  shampoo.  3.  After you shampoo, rinse your hair and body thoroughly to remove the  shampoo.                            4.  Use CHG as you would any other liquid soap.  You can apply chg directly  to the skin and wash                        Gently with a scrungie or clean washcloth.  5.  Apply the CHG Soap to your body ONLY FROM THE NECK DOWN.   Do not use on face/ open                           Wound or open sores. Avoid contact with eyes, ears mouth and genitals (private parts).                       Wash face,  Genitals (private parts) with your normal soap.             6.  Wash thoroughly, paying special attention to the area where your surgery  will be performed.  7.  Thoroughly rinse your body with warm water from the neck down.  8.  DO NOT shower/wash with your normal soap after using and rinsing off  the CHG Soap.                9.  Pat yourself dry with a clean towel.            10.  Wear clean pajamas.            11.  Place clean sheets on your bed the night of your first shower and do not  sleep with pets. Day of Surgery : Do not apply any lotions/deodorants the morning of surgery.  Please wear clean clothes to the hospital/surgery center.  FAILURE TO FOLLOW THESE INSTRUCTIONS MAY RESULT IN THE CANCELLATION OF YOUR SURGERY PATIENT SIGNATURE_________________________________  NURSE SIGNATURE__________________________________  ________________________________________________________________________

## 2023-08-20 NOTE — H&P (Signed)
 JSIAH MENTA [id number]   Referring Provider:  Self   Subjective   Chief Complaint: NEW PROBLEM     History of Present Illness:    63 year old male with no significant medical problems and no previous abdominal surgery returns for evaluation of a left inguinal hernia.  He was seen by Dr. Melton Squires about a year ago for the same and plans for open repair were made.  At that time, the patient had had a recent left femur fracture requiring surgical intervention and with subsequent constipation and straining he noted the left inguinal hernia which she thinks may have been does not but smaller before this.  He had been seen in the emergency room with an episode of incarceration and had this reduced. He recently had to return to the emergency room twice, at the end of May and beginning of June of this year with ongoing pain from his hernia.  He had a CT scan done on 5/28 which I have reviewed, this did show possible left trochanteric bursitis as well as prominent fat within the left spermatic cord perhaps representing a small inguinal hernia versus lipoma and bilateral hydroceles.  I have reviewed these images personally; there is at least some laxity on the right side as well as a hernia on the left side.. Reports constipation, pain, and a significant protrusion in the left groin.  He is also noted newly some pain and pressure in the right groin.   Review of Systems: A complete review of systems was obtained from the patient.  I have reviewed this information and discussed as appropriate with the patient.  See HPI as well for other ROS.   Medical History: History reviewed. No pertinent past medical history.  There is no problem list on file for this patient.   Past Surgical History:  Procedure Laterality Date   left hip surgery  09/02/2022     No Known Allergies  Current Outpatient Medications on File Prior to Visit  Medication Sig Dispense Refill   aspirin  325 MG EC tablet Take 325 mg  by mouth 2 (two) times daily     cyclobenzaprine  (FLEXERIL ) 10 MG tablet Take 10 mg by mouth 2 (two) times daily as needed     methocarbamoL  (ROBAXIN ) 750 MG tablet Take 750 mg by mouth     ondansetron  (ZOFRAN ) 4 MG tablet Take 4 mg by mouth every 8 (eight) hours as needed     oxyCODONE  (ROXICODONE ) 5 MG immediate release tablet Take 5 mg by mouth every 4 (four) hours as needed     No current facility-administered medications on file prior to visit.    Family History  Problem Relation Age of Onset   Coronary Artery Disease (Blocked arteries around heart) Father      Social History   Tobacco Use  Smoking Status Every Day   Types: Cigarettes  Smokeless Tobacco Never     Social History   Socioeconomic History   Marital status: Divorced  Tobacco Use   Smoking status: Every Day    Types: Cigarettes   Smokeless tobacco: Never  Vaping Use   Vaping status: Never Used  Substance and Sexual Activity   Alcohol use: Yes    Comment: 4 beers daily   Drug use: Never   Social Drivers of Health   Food Insecurity: No Food Insecurity (09/03/2022)   Received from [facility]   Hunger Vital Sign    Worried About Running Out of Food in the Last Year: Never true  Ran Out of Food in the Last Year: Never true  Transportation Needs: No Transportation Needs (09/03/2022)   Received from [facility] - Transportation    Lack of Transportation (Medical): No    Lack of Transportation (Non-Medical): No  Housing Stability: Unknown (08/20/2023)   Housing Stability Vital Sign    Homeless in the Last Year: No    Objective:    Vitals:   08/20/23 1023  PainSc: 0-No pain    There is no height or weight on file to calculate BMI.  Gen: A&Ox3, no distress  Chest: respiratory effort is normal. Abdomen: soft, nondistended, nontender.  Large left inguinoscrotal hernia which is reducible with gentle continuous pressure and contains bowel.  No overt hernia or protrusion on the right side  currently. Neuro: no gross deficit Psych: appropriate mood and affect, normal insight/judgment intact  Skin: warm and dry     Assessment and Plan:  Diagnoses and all orders for this visit:  Non-recurrent unilateral inguinal hernia without obstruction or gangrene     Large left inguinal hernia containing bowel with intermittent episodes of incarceration.  I recommend repairing this expeditiously and we discussed the relevant anatomy and we discussed options for repair.  I recommend an open approach specifically given large size of the hernia and went over the technique of the procedure.  Discussed risks of bleeding, infection, pain, scarring, injury to structures in the area including nerves, blood vessels, bowel, or bladder; risk of chronic pain, hernia recurrence, risk of seroma or hematoma, urinary retention, and risks of general anesthesia including cardiovascular, pulmonary, and thromboembolic complications.  We discussed typical postop recovery, timeline, and activity limitations.  At this point there is nothing palpable on the right side although he has recently had some pain and pressure there.  Regardless would defer intervening on the right side for now given size of the left hernia.  Questions were welcomed and answered to the patient's satisfaction. Patient wishes to proceed with scheduling which we will do as soon as possible.     Aldon Hung MD FACS

## 2023-08-20 NOTE — H&P (View-Only) (Signed)
 JSIAH MENTA [id number]   Referring Provider:  Self   Subjective   Chief Complaint: NEW PROBLEM     History of Present Illness:    63 year old male with no significant medical problems and no previous abdominal surgery returns for evaluation of a left inguinal hernia.  He was seen by Dr. Melton Squires about a year ago for the same and plans for open repair were made.  At that time, the patient had had a recent left femur fracture requiring surgical intervention and with subsequent constipation and straining he noted the left inguinal hernia which she thinks may have been does not but smaller before this.  He had been seen in the emergency room with an episode of incarceration and had this reduced. He recently had to return to the emergency room twice, at the end of May and beginning of June of this year with ongoing pain from his hernia.  He had a CT scan done on 5/28 which I have reviewed, this did show possible left trochanteric bursitis as well as prominent fat within the left spermatic cord perhaps representing a small inguinal hernia versus lipoma and bilateral hydroceles.  I have reviewed these images personally; there is at least some laxity on the right side as well as a hernia on the left side.. Reports constipation, pain, and a significant protrusion in the left groin.  He is also noted newly some pain and pressure in the right groin.   Review of Systems: A complete review of systems was obtained from the patient.  I have reviewed this information and discussed as appropriate with the patient.  See HPI as well for other ROS.   Medical History: History reviewed. No pertinent past medical history.  There is no problem list on file for this patient.   Past Surgical History:  Procedure Laterality Date   left hip surgery  09/02/2022     No Known Allergies  Current Outpatient Medications on File Prior to Visit  Medication Sig Dispense Refill   aspirin  325 MG EC tablet Take 325 mg  by mouth 2 (two) times daily     cyclobenzaprine  (FLEXERIL ) 10 MG tablet Take 10 mg by mouth 2 (two) times daily as needed     methocarbamoL  (ROBAXIN ) 750 MG tablet Take 750 mg by mouth     ondansetron  (ZOFRAN ) 4 MG tablet Take 4 mg by mouth every 8 (eight) hours as needed     oxyCODONE  (ROXICODONE ) 5 MG immediate release tablet Take 5 mg by mouth every 4 (four) hours as needed     No current facility-administered medications on file prior to visit.    Family History  Problem Relation Age of Onset   Coronary Artery Disease (Blocked arteries around heart) Father      Social History   Tobacco Use  Smoking Status Every Day   Types: Cigarettes  Smokeless Tobacco Never     Social History   Socioeconomic History   Marital status: Divorced  Tobacco Use   Smoking status: Every Day    Types: Cigarettes   Smokeless tobacco: Never  Vaping Use   Vaping status: Never Used  Substance and Sexual Activity   Alcohol use: Yes    Comment: 4 beers daily   Drug use: Never   Social Drivers of Health   Food Insecurity: No Food Insecurity (09/03/2022)   Received from [facility]   Hunger Vital Sign    Worried About Running Out of Food in the Last Year: Never true  Ran Out of Food in the Last Year: Never true  Transportation Needs: No Transportation Needs (09/03/2022)   Received from [facility] - Transportation    Lack of Transportation (Medical): No    Lack of Transportation (Non-Medical): No  Housing Stability: Unknown (08/20/2023)   Housing Stability Vital Sign    Homeless in the Last Year: No    Objective:    Vitals:   08/20/23 1023  PainSc: 0-No pain    There is no height or weight on file to calculate BMI.  Gen: A&Ox3, no distress  Chest: respiratory effort is normal. Abdomen: soft, nondistended, nontender.  Large left inguinoscrotal hernia which is reducible with gentle continuous pressure and contains bowel.  No overt hernia or protrusion on the right side  currently. Neuro: no gross deficit Psych: appropriate mood and affect, normal insight/judgment intact  Skin: warm and dry     Assessment and Plan:  Diagnoses and all orders for this visit:  Non-recurrent unilateral inguinal hernia without obstruction or gangrene     Large left inguinal hernia containing bowel with intermittent episodes of incarceration.  I recommend repairing this expeditiously and we discussed the relevant anatomy and we discussed options for repair.  I recommend an open approach specifically given large size of the hernia and went over the technique of the procedure.  Discussed risks of bleeding, infection, pain, scarring, injury to structures in the area including nerves, blood vessels, bowel, or bladder; risk of chronic pain, hernia recurrence, risk of seroma or hematoma, urinary retention, and risks of general anesthesia including cardiovascular, pulmonary, and thromboembolic complications.  We discussed typical postop recovery, timeline, and activity limitations.  At this point there is nothing palpable on the right side although he has recently had some pain and pressure there.  Regardless would defer intervening on the right side for now given size of the left hernia.  Questions were welcomed and answered to the patient's satisfaction. Patient wishes to proceed with scheduling which we will do as soon as possible.     Aldon Hung MD FACS

## 2023-08-23 NOTE — Progress Notes (Signed)
 COVID Vaccine received:  [x]  No []  Yes Date of any COVID positive Test in last 90 days:  none  PCP - none Cardiologist -   none  Chest x-ray - 09-02-22  1v  EPIC EKG - 09-02-2022  Epic   will repeat  Stress Test -  ECHO -  Cardiac Cath -  CT Coronary Calcium score:   Bowel Prep - [x]  No  []   Yes ______  Pacemaker / ICD device [x]  No []  Yes   Spinal Cord Stimulator:[x]  No []  Yes       History of Sleep Apnea? [x]  No []  Yes   CPAP used?- [x]  No []  Yes    Does the patient monitor blood sugar?   [x]  N/A   []  No []  Yes  Patient has: [x]  NO Hx DM   []  Pre-DM   []  DM1  []   DM2 Last A1c was:  5.4  on 09-02-22 normal      Blood Thinner / Instructions:none Aspirin  Instructions:  none  ERAS Protocol Ordered: [x]  No  []  Yes Patient is to be NPO after:MN  Dental hx: []  Dentures:  [x]  N/A      []  Bridge or Partial:                   []  Loose or Damaged teeth:   Comments: patient came to PST appt 1.5 hours late and he was intoxicated, he admits to snorting cocaine over the weekend and smoking pot. Drinks 6 pack of beer per day and has 5-6 shots of liquor per day.    Activity level: Able to walk up 2 flights of stairs without becoming significantly short of breath or having chest pain?  [x]  No   []    Yes   Anesthesia review: Smokes, ETOH, No PCP  Patient denies shortness of breath, fever, cough and chest pain at PAT appointment.  Patient verbalized understanding and agreement to the Pre-Surgical Instructions that were given to them at this PAT appointment. Patient was also educated of the need to review these PAT instructions again prior to his surgery.I reviewed the appropriate phone numbers to call if they have any and questions or concerns.

## 2023-08-24 ENCOUNTER — Encounter (HOSPITAL_COMMUNITY): Payer: Self-pay

## 2023-08-24 ENCOUNTER — Encounter (HOSPITAL_COMMUNITY)
Admission: RE | Admit: 2023-08-24 | Discharge: 2023-08-24 | Disposition: A | Discharge status: Home / Self Care | Source: Ambulatory Visit | Attending: Surgery | Admitting: Surgery

## 2023-08-24 ENCOUNTER — Other Ambulatory Visit: Payer: Self-pay

## 2023-08-24 VITALS — BP 128/82 | HR 80 | Temp 98.0°F | Resp 16 | Ht 72.0 in | Wt 153.0 lb

## 2023-08-24 DIAGNOSIS — Z01818 Encounter for other preprocedural examination: Secondary | ICD-10-CM

## 2023-08-24 DIAGNOSIS — Z01810 Encounter for preprocedural cardiovascular examination: Secondary | ICD-10-CM | POA: Diagnosis present

## 2023-08-24 DIAGNOSIS — I10 Essential (primary) hypertension: Secondary | ICD-10-CM | POA: Insufficient documentation

## 2023-08-24 DIAGNOSIS — F1090 Alcohol use, unspecified, uncomplicated: Secondary | ICD-10-CM

## 2023-08-24 NOTE — Progress Notes (Signed)
 Spoke with Peterson Brandt in triage at [facility] to make them aware that patient had been drinking prior to pre op appointment today. Arrived several hours late for pre op appointment. Steve Wright  admits to snorting cocaine and drinks 6 beers and 5-6 shots per day. Peterson Brandt stated she will send Dr. Lanell Pinta a message to make her aware.  I gave Peterson Brandt the contact information for centralized scheduling if Dr. Lanell Pinta decides to cancel patient.

## 2023-08-25 ENCOUNTER — Ambulatory Visit (HOSPITAL_COMMUNITY)
Admission: RE | Admit: 2023-08-25 | Discharge: 2023-08-25 | Disposition: A | Discharge status: Home / Self Care | Attending: Surgery | Admitting: Surgery

## 2023-08-25 ENCOUNTER — Encounter (HOSPITAL_COMMUNITY): Payer: Self-pay | Admitting: Surgery

## 2023-08-25 ENCOUNTER — Encounter (HOSPITAL_COMMUNITY)
Admission: RE | Disposition: A | Discharge status: Home / Self Care | Payer: Self-pay | Source: Home / Self Care | Attending: Surgery

## 2023-08-25 ENCOUNTER — Ambulatory Visit (HOSPITAL_COMMUNITY): Admitting: Anesthesiology

## 2023-08-25 ENCOUNTER — Other Ambulatory Visit: Payer: Self-pay

## 2023-08-25 ENCOUNTER — Ambulatory Visit (HOSPITAL_BASED_OUTPATIENT_CLINIC_OR_DEPARTMENT_OTHER): Admitting: Anesthesiology

## 2023-08-25 DIAGNOSIS — K4090 Unilateral inguinal hernia, without obstruction or gangrene, not specified as recurrent: Secondary | ICD-10-CM

## 2023-08-25 DIAGNOSIS — F17210 Nicotine dependence, cigarettes, uncomplicated: Secondary | ICD-10-CM | POA: Insufficient documentation

## 2023-08-25 HISTORY — PX: INGUINAL HERNIA REPAIR: SHX194

## 2023-08-25 SURGERY — REPAIR, HERNIA, INGUINAL, ADULT
Anesthesia: General | Laterality: Left

## 2023-08-25 MED ORDER — ONDANSETRON HCL 4 MG/2ML IJ SOLN: 4.0000 mg | Freq: Once | INTRAMUSCULAR | Status: DC | PRN

## 2023-08-25 MED ORDER — BUPIVACAINE LIPOSOME 1.3 % IJ SUSP
INTRAMUSCULAR | Status: AC
  Filled 2023-08-25: qty 20

## 2023-08-25 MED ORDER — SUGAMMADEX SODIUM 200 MG/2ML IV SOLN
INTRAVENOUS | Status: DC | PRN
  Administered 2023-08-25: 200 mg via INTRAVENOUS

## 2023-08-25 MED ORDER — OXYCODONE HCL 5 MG PO TABS: 5.0000 mg | ORAL_TABLET | Freq: Three times a day (TID) | ORAL | 0 refills | Status: AC | PRN

## 2023-08-25 MED ORDER — FENTANYL CITRATE (PF) 100 MCG/2ML IJ SOLN
INTRAMUSCULAR | Status: DC | PRN
  Administered 2023-08-25: 25 ug via INTRAVENOUS
  Administered 2023-08-25: 100 ug via INTRAVENOUS
  Administered 2023-08-25: 25 ug via INTRAVENOUS
  Administered 2023-08-25: 50 ug via INTRAVENOUS

## 2023-08-25 MED ORDER — FENTANYL CITRATE (PF) 100 MCG/2ML IJ SOLN
INTRAMUSCULAR | Status: AC
  Filled 2023-08-25: qty 2

## 2023-08-25 MED ORDER — OXYCODONE HCL 5 MG PO TABS: 5.0000 mg | ORAL_TABLET | Freq: Once | ORAL | Status: DC | PRN

## 2023-08-25 MED ORDER — BUPIVACAINE LIPOSOME 1.3 % IJ SUSP
INTRAMUSCULAR | Status: DC | PRN
  Administered 2023-08-25: 20 mL

## 2023-08-25 MED ORDER — CHLORHEXIDINE GLUCONATE 4 % EX SOLN: 60.0000 mL | Freq: Once | CUTANEOUS | Status: DC

## 2023-08-25 MED ORDER — ACETAMINOPHEN 10 MG/ML IV SOLN: 1000.0000 mg | Freq: Once | INTRAVENOUS | Status: DC | PRN

## 2023-08-25 MED ORDER — ACETAMINOPHEN 500 MG PO TABS
1000.0000 mg | ORAL_TABLET | ORAL | Status: AC
  Administered 2023-08-25: 1000 mg via ORAL
  Filled 2023-08-25: qty 2

## 2023-08-25 MED ORDER — DEXAMETHASONE SODIUM PHOSPHATE 10 MG/ML IJ SOLN
INTRAMUSCULAR | Status: DC | PRN
Start: 2023-08-25 — End: 2023-08-25
  Administered 2023-08-25: 10 mg via INTRAVENOUS

## 2023-08-25 MED ORDER — MIDAZOLAM HCL 5 MG/5ML IJ SOLN
INTRAMUSCULAR | Status: DC | PRN
  Administered 2023-08-25 (×2): 1 mg via INTRAVENOUS

## 2023-08-25 MED ORDER — FENTANYL CITRATE PF 50 MCG/ML IJ SOSY: 25.0000 ug | PREFILLED_SYRINGE | INTRAMUSCULAR | Status: DC | PRN

## 2023-08-25 MED ORDER — CEFAZOLIN SODIUM-DEXTROSE 2-4 GM/100ML-% IV SOLN
2.0000 g | INTRAVENOUS | Status: AC
  Administered 2023-08-25: 2 g via INTRAVENOUS
  Filled 2023-08-25: qty 100

## 2023-08-25 MED ORDER — EPHEDRINE SULFATE-NACL 50-0.9 MG/10ML-% IV SOSY
PREFILLED_SYRINGE | INTRAVENOUS | Status: DC | PRN
  Administered 2023-08-25: 5 mg via INTRAVENOUS

## 2023-08-25 MED ORDER — OXYCODONE HCL 5 MG/5ML PO SOLN: 5.0000 mg | Freq: Once | ORAL | Status: DC | PRN

## 2023-08-25 MED ORDER — ONDANSETRON HCL 4 MG/2ML IJ SOLN
INTRAMUSCULAR | Status: DC | PRN
  Administered 2023-08-25: 4 mg via INTRAVENOUS

## 2023-08-25 MED ORDER — ROCURONIUM BROMIDE 10 MG/ML (PF) SYRINGE
PREFILLED_SYRINGE | INTRAVENOUS | Status: DC | PRN
Start: 2023-08-25 — End: 2023-08-25
  Administered 2023-08-25: 60 mg via INTRAVENOUS

## 2023-08-25 MED ORDER — LIDOCAINE HCL (CARDIAC) PF 100 MG/5ML IV SOSY
PREFILLED_SYRINGE | INTRAVENOUS | Status: DC | PRN
  Administered 2023-08-25: 100 mg via INTRATRACHEAL

## 2023-08-25 MED ORDER — CHLORHEXIDINE GLUCONATE 0.12 % MT SOLN
15.0000 mL | Freq: Once | OROMUCOSAL | Status: AC
  Administered 2023-08-25: 15 mL via OROMUCOSAL

## 2023-08-25 MED ORDER — OXYCODONE HCL 5 MG PO TABS: 5.0000 mg | ORAL_TABLET | ORAL | Status: DC | PRN

## 2023-08-25 MED ORDER — DOCUSATE SODIUM 100 MG PO CAPS: 100.0000 mg | ORAL_CAPSULE | Freq: Two times a day (BID) | ORAL | 0 refills | Status: AC

## 2023-08-25 MED ORDER — PROPOFOL 10 MG/ML IV BOLUS
INTRAVENOUS | Status: AC
  Filled 2023-08-25: qty 20

## 2023-08-25 MED ORDER — ONDANSETRON HCL 4 MG/2ML IJ SOLN
INTRAMUSCULAR | Status: AC
  Filled 2023-08-25: qty 2

## 2023-08-25 MED ORDER — PROPOFOL 10 MG/ML IV BOLUS
INTRAVENOUS | Status: DC | PRN
Start: 2023-08-25 — End: 2023-08-25
  Administered 2023-08-25: 140 mg via INTRAVENOUS

## 2023-08-25 MED ORDER — LACTATED RINGERS IV SOLN: INTRAVENOUS | Status: DC

## 2023-08-25 MED ORDER — FENTANYL CITRATE (PF) 100 MCG/2ML IJ SOLN
INTRAMUSCULAR | Status: AC
Start: 2023-08-25 — End: ?
  Filled 2023-08-25: qty 2

## 2023-08-25 MED ORDER — ROCURONIUM BROMIDE 10 MG/ML (PF) SYRINGE
PREFILLED_SYRINGE | INTRAVENOUS | Status: AC
  Filled 2023-08-25: qty 10

## 2023-08-25 MED ORDER — MIDAZOLAM HCL 2 MG/2ML IJ SOLN
INTRAMUSCULAR | Status: AC
  Filled 2023-08-25: qty 2

## 2023-08-25 MED ORDER — ACETAMINOPHEN 325 MG PO TABS: 650.0000 mg | ORAL_TABLET | ORAL | Status: DC | PRN

## 2023-08-25 MED ORDER — PHENYLEPHRINE 80 MCG/ML (10ML) SYRINGE FOR IV PUSH (FOR BLOOD PRESSURE SUPPORT)
PREFILLED_SYRINGE | INTRAVENOUS | Status: DC | PRN
Start: 2023-08-25 — End: 2023-08-25
  Administered 2023-08-25: 120 ug via INTRAVENOUS
  Administered 2023-08-25: 80 ug via INTRAVENOUS

## 2023-08-25 MED ORDER — DEXAMETHASONE SODIUM PHOSPHATE 10 MG/ML IJ SOLN
INTRAMUSCULAR | Status: AC
  Filled 2023-08-25: qty 1

## 2023-08-25 MED ORDER — ACETAMINOPHEN 650 MG RE SUPP: 650.0000 mg | RECTAL | Status: DC | PRN

## 2023-08-25 MED ORDER — BUPIVACAINE LIPOSOME 1.3 % IJ SUSP: 20.0000 mL | Freq: Once | INTRAMUSCULAR | Status: DC

## 2023-08-25 MED ORDER — BUPIVACAINE-EPINEPHRINE (PF) 0.25% -1:200000 IJ SOLN
INTRAMUSCULAR | Status: AC
  Filled 2023-08-25: qty 30

## 2023-08-25 MED ORDER — BUPIVACAINE-EPINEPHRINE 0.25% -1:200000 IJ SOLN
INTRAMUSCULAR | Status: DC | PRN
  Administered 2023-08-25: 30 mL

## 2023-08-25 MED ORDER — GABAPENTIN 300 MG PO CAPS
300.0000 mg | ORAL_CAPSULE | ORAL | Status: AC
  Administered 2023-08-25: 300 mg via ORAL
  Filled 2023-08-25: qty 1

## 2023-08-25 MED ORDER — ORAL CARE MOUTH RINSE: 15.0000 mL | Freq: Once | OROMUCOSAL | Status: AC

## 2023-08-25 MED ORDER — ASPIRIN 325 MG PO TBEC: 325.0000 mg | DELAYED_RELEASE_TABLET | Freq: Every day | ORAL | Status: AC

## 2023-08-25 SURGICAL SUPPLY — 28 items
BAG COUNTER SPONGE SURGICOUNT (BAG) IMPLANT
BENZOIN TINCTURE PRP APPL 2/3 (GAUZE/BANDAGES/DRESSINGS) ×1 IMPLANT
BLADE SURG 15 STRL LF DISP TIS (BLADE) ×1 IMPLANT
CHLORAPREP W/TINT 26 (MISCELLANEOUS) ×1 IMPLANT
COVER SURGICAL LIGHT HANDLE (MISCELLANEOUS) ×1 IMPLANT
DRAIN PENROSE 0.5X18 (DRAIN) ×1 IMPLANT
DRAPE LAPAROSCOPIC ABDOMINAL (DRAPES) ×1 IMPLANT
ELECT REM PT RETURN 15FT ADLT (MISCELLANEOUS) ×1 IMPLANT
GAUZE SPONGE 4X4 12PLY STRL (GAUZE/BANDAGES/DRESSINGS) IMPLANT
GLOVE BIO SURGEON STRL SZ 6 (GLOVE) ×1 IMPLANT
GLOVE INDICATOR 6.5 STRL GRN (GLOVE) ×1 IMPLANT
GOWN STRL REUS W/ TWL LRG LVL3 (GOWN DISPOSABLE) ×1 IMPLANT
KIT BASIN OR (CUSTOM PROCEDURE TRAY) ×1 IMPLANT
KIT TURNOVER KIT A (KITS) IMPLANT
MARKER SKIN DUAL TIP RULER LAB (MISCELLANEOUS) ×1 IMPLANT
MESH OVITEX 1S PERM 6X10 6L (Mesh General) IMPLANT
NDL HYPO 22X1.5 SAFETY MO (MISCELLANEOUS) ×1 IMPLANT
NEEDLE HYPO 22X1.5 SAFETY MO (MISCELLANEOUS) ×1 IMPLANT
PACK GENERAL/GYN (CUSTOM PROCEDURE TRAY) ×1 IMPLANT
SPIKE FLUID TRANSFER (MISCELLANEOUS) ×1 IMPLANT
STRIP CLOSURE SKIN 1/2X4 (GAUZE/BANDAGES/DRESSINGS) ×1 IMPLANT
SUT ETHIBOND 0 MO6 C/R (SUTURE) ×1 IMPLANT
SUT MNCRL AB 4-0 PS2 18 (SUTURE) ×1 IMPLANT
SUT PDS AB 0 CT1 36 (SUTURE) ×2 IMPLANT
SUT VIC AB 3-0 SH 27XBRD (SUTURE) ×2 IMPLANT
SUT VICRYL 3 0 BR 18 UND (SUTURE) ×1 IMPLANT
SYR CONTROL 10ML LL (SYRINGE) ×1 IMPLANT
TOWEL OR 17X26 10 PK STRL BLUE (TOWEL DISPOSABLE) ×1 IMPLANT

## 2023-08-25 NOTE — Anesthesia Procedure Notes (Signed)
 Procedure Name: Intubation Date/Time: 08/25/2023 7:31 AM  Performed by: Raylene Calamity, CRNAPre-anesthesia Checklist: Patient identified, Emergency Drugs available, Suction available and Patient being monitored Patient Re-evaluated:Patient Re-evaluated prior to induction Oxygen Delivery Method: Circle System Utilized Preoxygenation: Pre-oxygenation with 100% oxygen Induction Type: IV induction Ventilation: Mask ventilation without difficulty Laryngoscope Size: Mac and 4 Grade View: Grade III Tube type: Oral Tube size: 7.5 mm Number of attempts: 2 (Anterior airway, though able to see base of arytenoids. ETT misplaced when advanced into esophagus. Successful advancement second attempt by repositioning head) Airway Equipment and Method: Stylet and Oral airway Placement Confirmation: ETT inserted through vocal cords under direct vision, positive ETCO2 and breath sounds checked- equal and bilateral Tube secured with: Tape Dental Injury: Teeth and Oropharynx as per pre-operative assessment  Difficulty Due To: Difficult Airway- due to anterior larynx

## 2023-08-25 NOTE — Interval H&P Note (Signed)
 History and Physical Interval Note:  08/25/2023 6:59 AM  [name]  has presented today for surgery, with the diagnosis of INQUINAL HERNIA.  The various methods of treatment have been discussed with the patient and family. After consideration of risks, benefits and other options for treatment, the patient has consented to  Procedure(s) with comments: REPAIR, HERNIA, INGUINAL, ADULT (Left) - LATERALITY: LEFT as a surgical intervention.  The patient's history has been reviewed, patient examined, no change in status, stable for surgery.  I have reviewed the patient's chart and labs.  Questions were answered to the patient's satisfaction.     Steve Wright Loyola Rummage

## 2023-08-25 NOTE — Anesthesia Postprocedure Evaluation (Signed)
 Anesthesia Post Note  Patient: Steve Wright  Procedure(s) Performed: REPAIR, HERNIA, INGUINAL, ADULT (Left)     Patient location during evaluation: PACU Anesthesia Type: General Level of consciousness: awake and alert Pain management: pain level controlled Vital Signs Assessment: post-procedure vital signs reviewed and stable Respiratory status: spontaneous breathing, nonlabored ventilation, respiratory function stable and patient connected to nasal cannula oxygen Cardiovascular status: blood pressure returned to baseline and stable Postop Assessment: no apparent nausea or vomiting Anesthetic complications: no   No notable events documented.  Last Vitals:  Vitals:   08/25/23 0945 08/25/23 0959  BP: (!) 145/88 (!) 157/99  Pulse: 65 64  Resp: 12 20  Temp:  (!) 36.3 C  SpO2: 97% 99%    Last Pain:  Vitals:   08/25/23 0959  TempSrc: Temporal  PainSc: 0-No pain                 Leslye Rast

## 2023-08-25 NOTE — Anesthesia Preprocedure Evaluation (Signed)
 Anesthesia Evaluation  Patient identified by MRN, date of birth, ID band Patient awake    Reviewed: Allergy & Precautions, NPO status , Patient's Chart, lab work & pertinent test results, reviewed documented beta blocker date and time   History of Anesthesia Complications Negative for: history of anesthetic complications  Airway Mallampati: III  TM Distance: >3 FB   Mouth opening: Limited Mouth Opening  Dental  (+) Poor Dentition   Pulmonary neg COPD, Current Smoker and Patient abstained from smoking.   breath sounds clear to auscultation       Cardiovascular (-) CAD, (-) Past MI and (-) Cardiac Stents  Rhythm:Regular Rate:Normal     Neuro/Psych neg Seizures    GI/Hepatic ,neg GERD  ,,(+) neg Cirrhosis    substance abuse  alcohol use and cocaine use  Endo/Other    Renal/GU Renal disease     Musculoskeletal   Abdominal   Peds  Hematology   Anesthesia Other Findings   Reproductive/Obstetrics                              Anesthesia Physical Anesthesia Plan  ASA: 3  Anesthesia Plan: General   Post-op Pain Management:    Induction: Intravenous  PONV Risk Score and Plan: 2 and Ondansetron  and Dexamethasone   Airway Management Planned: Oral ETT  Additional Equipment:   Intra-op Plan:   Post-operative Plan: Extubation in OR  Informed Consent: I have reviewed the patients History and Physical, chart, labs and discussed the procedure including the risks, benefits and alternatives for the proposed anesthesia with the patient or authorized representative who has indicated his/her understanding and acceptance.     Dental advisory given  Plan Discussed with: CRNA  Anesthesia Plan Comments:          Anesthesia Quick Evaluation

## 2023-08-25 NOTE — Discharge Instructions (Signed)
 HERNIA REPAIR: POST OP INSTRUCTIONS   EAT Gradually transition to a your usual diet over the next few days after discharge.  WALK Walk an hour a day (cumulative- not all at once).  Control your pain to do that.    CONTROL PAIN Control pain so that you can walk, sleep, tolerate sneezing/coughing, and go up/down stairs.  HAVE A BOWEL MOVEMENT DAILY Keep your bowels regular to avoid problems.  OK to try a laxative to override constipation.  OK to use an antidiarrheal to slow down diarrhea.  Call if not better after 2 tries  CALL IF YOU HAVE PROBLEMS/CONCERNS Call if you are still struggling despite following these instructions. Call if you have concerns not answered by these instructions  ######################################################################    DIET: Follow a light bland diet & liquids the first 24 hours after arrival home, such as soup, liquids, starches, etc.  Be sure to drink plenty of fluids.  Quickly advance to a usual solid diet within a few days.  Avoid fast food or heavy meals initially as you are more likely to get nauseated or have irregular bowels.  Take your usually prescribed home medications unless otherwise directed.  PAIN CONTROL: Pain is best controlled by a usual combination of three different methods TOGETHER: Ice/Heat Over the counter pain medication Prescription pain medication Most patients will experience some swelling and bruising around the hernia(s) such as the bellybutton, groins, or old incisions.  Ice packs or heating pads (30-60 minutes up to 6 times a day) will help. Use ice for the first few days to help decrease swelling and bruising, then switch to heat to help relax tight/sore spots and speed recovery.  Some people prefer to use ice alone, heat alone, alternating between ice & heat.  Experiment to what works for you.  Swelling and bruising can take several weeks to resolve.   It is helpful to take an over-the-counter pain medication  regularly for the first days: Naproxen (Aleve, etc)  Two 220mg  tabs twice a day OR Ibuprofen (Advil, etc) Three 200mg  tabs four times a day (every meal & bedtime) AND Acetaminophen  (Tylenol , etc) 325-650mg  four times a day (every meal & bedtime) A  prescription for pain medication should be given to you upon discharge.  Take your pain medication as prescribed, IF NEEDED.  If you are having problems/concerns with the prescription medicine (does not control pain, nausea, vomiting, rash, itching, etc), please call us  [phone]) [phone] to see if we need to switch you to a different pain medicine that will work better for you and/or control your side effect better. If you need a refill on your pain medication, please contact your pharmacy.  They will contact our office to request authorization.  Do not consume alcohol or other drugs while taking prescription pain medication. Prescriptions will not be filled after 5 pm or on week-ends.  Avoid getting constipated.  Between the surgery and the pain medications, it is common to experience some constipation.  Increasing fluid intake and taking a fiber supplement (such as Metamucil, Citrucel, FiberCon, MiraLax , etc) 1-2 times a day regularly will usually help prevent this problem from occurring.  A mild laxative (prune juice, Milk of Magnesia, MiraLax , etc) should be taken according to package directions if there are no bowel movements after 48 hours.    Wash / shower every day, starting 2 days after surgery.  You may shower over the steri strips (small white tapes) which are waterproof.  No rubbing, scrubbing, lotions or ointments  to incision(s). Do not soak or submerge incision.   Remove your outer bandage (gauze and tape) 2 days after surgery. Steri strips will peel off after 1-2 weeks. You may leave the incision open to air.  You may replace a dressing/Band-Aid to cover an incision for comfort if you wish.  Continue to shower over incision(s) after the dressing  is off.  ACTIVITIES as tolerated:   You may resume regular (light) daily activities beginning the next day--such as daily self-care, walking, climbing stairs--gradually increasing activities as tolerated.  Control your pain so that you can walk an hour a day.  If you can walk 30 minutes without difficulty, it is safe to try more intense activity such as jogging, treadmill, bicycling, low-impact aerobics, swimming, etc. Refrain from the most intensive and strenuous activity such as sit-ups, heavy lifting, contact sports, etc  Refrain from any heavy lifting (more than 20lb) or straining until 6 weeks after surgery.   DO NOT PUSH THROUGH PAIN.  Let pain be your guide: If it hurts to do something, don't do it.  Pain is your body warning you to avoid that activity for another week until the pain goes down. You may drive when you are no longer taking prescription pain medication, you can comfortably wear a seatbelt, and you can safely maneuver your car and apply brakes. You may have sexual intercourse when it is comfortable.   FOLLOW UP in our office Please call [facility] at [phone] to set up an appointment to see your surgeon in the office for a follow-up appointment approximately 2-3 weeks after your surgery. Make sure that you call for this appointment the day you arrive home to insure a convenient appointment time.  9.  If you have disability of FMLA / Family leave forms, please bring the forms to the office for processing.  (do not give to your surgeon).  WHEN TO CALL US  ([phone]) [phone]: Poor pain control Reactions / problems with new medications (rash/itching, nausea, etc)  Fever over 101.5 F (38.5 C) Inability to urinate Nausea and/or vomiting Worsening swelling or bruising Continued bleeding from incision. Increased pain, redness, or drainage from the incision   The clinic staff is available to answer your questions during regular business hours (8:30am-5pm).  Please don't hesitate to call  and ask to speak to one of our nurses for clinical concerns.   If you have a medical emergency, go to the nearest emergency room or call 911.  A surgeon from [facility] is always on call at the hospitals in [facility], [place] [address], [address], [place], [place]  [address] ?  [address] [address], [place], [place]   [address] MAIN: [phone] ? TOLL FREE: [phone] ? FAX: [phone] [email or web address]

## 2023-08-25 NOTE — Op Note (Signed)
 Operative Note  [name]  [id number]  [id number]  08/25/2023   Surgeon: Aldon Hung MD FACS   Procedure performed: Open left inguinal hernia repair with mesh   Preop diagnosis:  left inguinal hernia   Post-op diagnosis/intraop findings: large left indirect inguinoscrotal hernia   Specimens: none   EBL: 5cc   Complications: none   Description of procedure: After confirming informed consent, the patient was taken to the operating room and placed supine on operating room table where general anesthesia was initiated, preoperative antibiotics were administered, SCDs applied, and a formal timeout was performed. The groin was clipped, prepped and draped in the usual sterile fashion. An oblique incision was made the just above the inguinal ligament after infiltrating the tissues with local anesthetic (exparel mixed with 0.25% marcaine with epinephrine ). Soft tissues were dissected using electrocautery until the external oblique aponeurosis was encountered. This was divided sharply to expand the external ring. A plane was bluntly developed between the spermatic cord and the external oblique. The ilioinguinal nerve was ligated with 3-0 vicryl ties. The spermatic cord was then bluntly dissected away from the pubic tubercle and encircled with a Penrose. Inspection of the inguinal anatomy revealed a large indirect hernia sac extending well into the scrotum. The indirect hernia sac was carefully dissected away from the cord structures and skeletonized to the level of the internal ring, where it was reduced intact into the abdomen.  The inguinal floor was reconstructed suturing the conjoint tendon to the inguinal ligament with interrupted 0 PDS, leaving an internal ring just sufficient for the cord structures. An Ovitex 1s permanent 6x10cm mesh was brought onto the field and trimmed slightly. This was sutured to the pubic tubercle fascia, inferior shelving edge and to the internal oblique superiorly  with interrupted 0 ethibonds. The tails of the mesh were wrapped around the spermatic cord, ensuring adequate room for the cord, and sutured to each other with 0 ethibond, and then directed laterally to lie flat beneath the external oblique aponeurosis. An additional 0 ethibond was placed medially to reinforce the slit in the mesh. Hemostasis was ensured within the wound. The Penrose was removed. The external oblique aponeurosis was reapproximated with a running 3-0 Vicryl to re-create a narrowed external ring. More local was infiltrated around the pubic tubercle and in the plane just below the external oblique. The Scarpa's was reapproximated with interrupted 3-0 Vicryls. The skin was closed with a running subcuticular 4-0 Monocryl. The remainder of the local was injected in the subcutaneous and subcuticular space. The field was then cleaned, benzoin and Steri-Strips and sterile bandage were applied. The patient was then awakened extubated and taken to PACU in stable condition.    All counts were correct at the completion of the case

## 2023-08-25 NOTE — Transfer of Care (Signed)
 Immediate Anesthesia Transfer of Care Note  Patient: Steve Wright  Procedure(s) Performed: REPAIR, HERNIA, INGUINAL, ADULT (Left)  Patient Location: PACU  Anesthesia Type:General  Level of Consciousness: sedated  Airway & Oxygen Therapy: Patient Spontanous Breathing  Post-op Assessment: Report given to RN  Post vital signs: Reviewed and stable  Last Vitals:  Vitals Value Taken Time  BP 123/78 08/25/23 0855  Temp    Pulse 72 08/25/23 0857  Resp 11 08/25/23 0857  SpO2 97 % 08/25/23 0857  Vitals shown include unfiled device data.  Last Pain:  Vitals:   08/25/23 0855  TempSrc:   PainSc: Asleep         Complications: No notable events documented.

## 2023-08-26 ENCOUNTER — Encounter (HOSPITAL_COMMUNITY): Payer: Self-pay | Admitting: Surgery
